# Patient Record
Sex: Male | Born: 1990 | Race: White | Hispanic: No | Marital: Single | Smoking: Current some day smoker
Health system: Southern US, Community
[De-identification: ages and names within clinical notes are randomized; demographics above are authoritative.]

---
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
## 2011-06-25 LAB — URINE MICROSCOPIC-ADD ON

## 2011-06-25 LAB — URINALYSIS, ROUTINE W REFLEX MICROSCOPIC
Glucose, UA: NEGATIVE
Ketones, ur: NEGATIVE
Leukocytes, UA: NEGATIVE
Nitrite: NEGATIVE
Specific Gravity, Urine: 1.025
pH: 6

## 2011-06-25 LAB — RPR: RPR Ser Ql: NONREACTIVE

## 2011-06-25 LAB — DIFFERENTIAL
Basophils Relative: 1
Lymphocytes Relative: 30
Monocytes Absolute: 0.8
Monocytes Relative: 11
Neutro Abs: 3.2

## 2011-06-25 LAB — CBC
HCT: 43.3
Hemoglobin: 15.2
MCHC: 35
RBC: 4.99

## 2011-06-25 LAB — TSH: TSH: 2.042

## 2011-06-25 LAB — HEPATIC FUNCTION PANEL
AST: 22
Albumin: 4.5

## 2011-06-25 LAB — RAPID URINE DRUG SCREEN, HOSP PERFORMED
Amphetamines: NOT DETECTED
Barbiturates: NOT DETECTED
Benzodiazepines: NOT DETECTED
Opiates: NOT DETECTED
Tetrahydrocannabinol: NOT DETECTED

## 2011-06-25 LAB — BASIC METABOLIC PANEL
CO2: 28
Calcium: 10.2
Potassium: 4.2
Sodium: 141

## 2011-06-25 LAB — I-STAT 8, (EC8 V) (CONVERTED LAB)
BUN: 12
Chloride: 106
Glucose, Bld: 96
Potassium: 3.8
pCO2, Ven: 41.6 — ABNORMAL LOW
pH, Ven: 7.39 — ABNORMAL HIGH

## 2011-06-25 LAB — VALPROIC ACID LEVEL: Valproic Acid Lvl: 54.6

## 2011-06-25 LAB — LITHIUM LEVEL: Lithium Lvl: <0.25 — ABNORMAL LOW

## 2013-05-17 ENCOUNTER — Encounter (HOSPITAL_COMMUNITY): Payer: Self-pay | Admitting: Emergency Medicine

## 2013-05-17 ENCOUNTER — Emergency Department (HOSPITAL_COMMUNITY): Payer: Managed Care, Other (non HMO)

## 2013-05-17 ENCOUNTER — Emergency Department (HOSPITAL_COMMUNITY)
Admission: EM | Admit: 2013-05-17 | Discharge: 2013-05-18 | Disposition: A | Discharge status: Home / Self Care | Payer: Managed Care, Other (non HMO) | Attending: Emergency Medicine | Admitting: Emergency Medicine

## 2013-05-17 DIAGNOSIS — Z880 Allergy status to penicillin: Secondary | ICD-10-CM | POA: Insufficient documentation

## 2013-05-17 DIAGNOSIS — Z79899 Other long term (current) drug therapy: Secondary | ICD-10-CM | POA: Insufficient documentation

## 2013-05-17 DIAGNOSIS — F17200 Nicotine dependence, unspecified, uncomplicated: Secondary | ICD-10-CM | POA: Insufficient documentation

## 2013-05-17 DIAGNOSIS — N200 Calculus of kidney: Secondary | ICD-10-CM

## 2013-05-17 LAB — CBC WITH DIFFERENTIAL/PLATELET
Basophils Absolute: 0 10*3/uL (ref 0.0–0.1)
HCT: 42.9 % (ref 39.0–52.0)
Hemoglobin: 15 g/dL (ref 13.0–17.0)
Lymphocytes Relative: 8 % — ABNORMAL LOW (ref 12–46)
Monocytes Absolute: 1.2 10*3/uL — ABNORMAL HIGH (ref 0.1–1.0)
Monocytes Relative: 11 % (ref 3–12)
Neutro Abs: 8.9 10*3/uL — ABNORMAL HIGH (ref 1.7–7.7)
RBC: 4.92 MIL/uL (ref 4.22–5.81)
WBC: 11 10*3/uL — ABNORMAL HIGH (ref 4.0–10.5)

## 2013-05-17 LAB — URINALYSIS, ROUTINE W REFLEX MICROSCOPIC
Ketones, ur: NEGATIVE mg/dL
Protein, ur: 30 mg/dL — AB
Urobilinogen, UA: 0.2 mg/dL (ref 0.0–1.0)

## 2013-05-17 LAB — URINE MICROSCOPIC-ADD ON

## 2013-05-17 LAB — COMPREHENSIVE METABOLIC PANEL
AST: 29 U/L (ref 0–37)
BUN: 14 mg/dL (ref 6–23)
CO2: 24 mEq/L (ref 19–32)
Chloride: 104 mEq/L (ref 96–112)
Creatinine, Ser: 1.05 mg/dL (ref 0.50–1.35)
GFR calc non Af Amer: >90 mL/min (ref >90)
Total Bilirubin: 0.9 mg/dL (ref 0.3–1.2)

## 2013-05-17 MED ORDER — OXYCODONE-ACETAMINOPHEN 5-325 MG PO TABS: 1.0000 | ORAL_TABLET | Freq: Four times a day (QID) | ORAL | Status: DC | PRN

## 2013-05-17 MED ORDER — TAMSULOSIN HCL 0.4 MG PO CAPS
0.4000 mg | ORAL_CAPSULE | Freq: Once | ORAL | Status: AC
  Administered 2013-05-17: 0.4 mg via ORAL
  Filled 2013-05-17: qty 1

## 2013-05-17 MED ORDER — SODIUM CHLORIDE 0.9 % IV BOLUS (SEPSIS)
1000.0000 mL | Freq: Once | INTRAVENOUS | Status: AC
  Administered 2013-05-17: 1000 mL via INTRAVENOUS

## 2013-05-17 MED ORDER — ONDANSETRON HCL 4 MG/2ML IJ SOLN
4.0000 mg | Freq: Once | INTRAMUSCULAR | Status: AC
  Administered 2013-05-17: 4 mg via INTRAVENOUS
  Filled 2013-05-17: qty 2

## 2013-05-17 MED ORDER — CIPROFLOXACIN HCL 500 MG PO TABS
500.0000 mg | ORAL_TABLET | Freq: Once | ORAL | Status: AC
  Administered 2013-05-17: 500 mg via ORAL
  Filled 2013-05-17: qty 1

## 2013-05-17 MED ORDER — HYDROMORPHONE HCL PF 1 MG/ML IJ SOLN
1.0000 mg | Freq: Once | INTRAMUSCULAR | Status: AC
  Administered 2013-05-17: 1 mg via INTRAVENOUS
  Filled 2013-05-17: qty 1

## 2013-05-17 MED ORDER — CIPROFLOXACIN HCL 500 MG PO TABS: 500.0000 mg | ORAL_TABLET | Freq: Two times a day (BID) | ORAL | Status: DC

## 2013-05-17 MED ORDER — TAMSULOSIN HCL 0.4 MG PO CAPS: 0.4000 mg | ORAL_CAPSULE | Freq: Every day | ORAL | Status: DC

## 2013-05-17 MED ORDER — KETOROLAC TROMETHAMINE 30 MG/ML IJ SOLN
30.0000 mg | Freq: Once | INTRAMUSCULAR | Status: AC
  Administered 2013-05-17: 30 mg via INTRAVENOUS
  Filled 2013-05-17: qty 1

## 2013-05-17 MED ORDER — LORAZEPAM 2 MG/ML IJ SOLN
0.5000 mg | Freq: Once | INTRAMUSCULAR | Status: AC
  Administered 2013-05-17: 0.5 mg via INTRAVENOUS
  Filled 2013-05-17: qty 1

## 2013-05-17 MED ORDER — PROMETHAZINE HCL 25 MG PO TABS: 25.0000 mg | ORAL_TABLET | Freq: Four times a day (QID) | ORAL | Status: DC | PRN

## 2013-05-17 NOTE — ED Provider Notes (Signed)
CSN: [id number]     Arrival date & time 05/17/13  1819 History   First MD Initiated Contact with Patient 05/17/13 1844     Chief Complaint  Patient presents with  . Testicle Pain   (Consider location/radiation/quality/duration/timing/severity/associated sxs/prior Treatment) Patient is a 22 y.o. male presenting with testicular pain. The history is provided by the patient (pt complains of scrotal pain.).  Testicle Pain This is a new problem. The current episode started less than 1 hour ago. The problem occurs constantly. The problem has not changed since onset.Pertinent negatives include no chest pain, no abdominal pain and no headaches. Nothing aggravates the symptoms. Nothing relieves the symptoms.    History reviewed. No pertinent past medical history. History reviewed. No pertinent past surgical history. History reviewed. No pertinent family history. History  Substance Use Topics  . Smoking status: Current Some Day Smoker  . Smokeless tobacco: Not on file  . Alcohol Use: Yes    Review of Systems  Constitutional: Negative for appetite change and fatigue.  HENT: Negative for congestion, sinus pressure and ear discharge.   Eyes: Negative for discharge.  Respiratory: Negative for cough.   Cardiovascular: Negative for chest pain.  Gastrointestinal: Negative for abdominal pain and diarrhea.  Genitourinary: Positive for testicular pain. Negative for frequency and hematuria.  Musculoskeletal: Negative for back pain.  Skin: Negative for rash.  Neurological: Negative for seizures and headaches.  Psychiatric/Behavioral: Negative for hallucinations.    Allergies  Penicillins  Home Medications   Current Outpatient Rx  Name  Route  Sig  Dispense  Refill  . oxyCODONE-acetaminophen (PERCOCET/ROXICET) 5-325 MG per tablet   Oral   Take 1 tablet by mouth every 6 (six) hours as needed for pain.   20 tablet   0   . promethazine (PHENERGAN) 25 MG tablet   Oral   Take 1 tablet (25 mg  total) by mouth every 6 (six) hours as needed for nausea.   15 tablet   0   . tamsulosin (FLOMAX) 0.4 MG CAPS capsule   Oral   Take 1 capsule (0.4 mg total) by mouth daily.   6 capsule   0    BP 123/101  Pulse 89  Temp(Src) 97.5 F (36.4 C) (Oral)  Resp 24  SpO2 98% Physical Exam  Constitutional: He is oriented to person, place, and time. He appears well-developed.  HENT:  Head: Normocephalic.  Eyes: Conjunctivae and EOM are normal. No scleral icterus.  Neck: Neck supple. No thyromegaly present.  Cardiovascular: Normal rate and regular rhythm.  Exam reveals no gallop and no friction rub.   No murmur heard. Pulmonary/Chest: No stridor. He has no wheezes. He has no rales. He exhibits no tenderness.  Abdominal: He exhibits no distension. There is no tenderness. There is no rebound.  Genitourinary:  Tender scrotum.  Not swollen  Musculoskeletal: Normal range of motion. He exhibits no edema.  Lymphadenopathy:    He has no cervical adenopathy.  Neurological: He is oriented to person, place, and time. Coordination normal.  Skin: No rash noted. No erythema.  Psychiatric: He has a normal mood and affect. His behavior is normal.    ED Course  Procedures (including critical care time) Labs Review Labs Reviewed  CBC WITH DIFFERENTIAL - Abnormal; Notable for the following:    WBC 11.0 (*)    Neutrophils Relative % 81 (*)    Neutro Abs 8.9 (*)    Lymphocytes Relative 8 (*)    Monocytes Absolute 1.2 (*)  All other components within normal limits  COMPREHENSIVE METABOLIC PANEL - Abnormal; Notable for the following:    Glucose, Bld 106 (*)    All other components within normal limits  URINALYSIS, ROUTINE W REFLEX MICROSCOPIC - Abnormal; Notable for the following:    Color, Urine AMBER (*)    APPearance CLOUDY (*)    Specific Gravity, Urine 1.031 (*)    Hgb urine dipstick LARGE (*)    Bilirubin Urine SMALL (*)    Protein, ur 30 (*)    All other components within normal  limits  URINE MICROSCOPIC-ADD ON - Abnormal; Notable for the following:    Bacteria, UA MANY (*)    Crystals CA OXALATE CRYSTALS (*)    All other components within normal limits  URINE CULTURE   Imaging Review Ct Abdomen Pelvis Wo Contrast  05/17/2013   *RADIOLOGY REPORT*  Clinical Data: Kidney stone.  CT ABDOMEN AND PELVIS WITHOUT CONTRAST  Technique:  Multidetector CT imaging of the abdomen and pelvis was performed following the standard protocol without intravenous contrast.  Comparison: None.  Findings:  BODY WALL: Unremarkable.  LOWER CHEST:  Mediastinum: Unremarkable.  Lungs/pleura: No consolidation.  ABDOMEN/PELVIS:  Liver: No focal abnormality.  Biliary: No evidence of biliary obstruction or stone.  Pancreas: Unremarkable.  Spleen: Unremarkable.  Adrenals: Unremarkable.  Kidneys and ureters: Moderate left hydroureteronephrosis secondary to a 4 mm stone at the left ureterovesicular junction.  Additional punctate stone in the lower pole left kidney.  The left kidney is enlarged and relatively edematous compared to the right.  Bladder: Unremarkable.  Bowel: No obstruction. Normal appendix.  Retroperitoneum: No mass or adenopathy.  Peritoneum: No free fluid or gas.  Reproductive: Unremarkable.  Vascular: No acute abnormality.  OSSEOUS: No acute abnormalities. No suspicious lytic or blastic lesions.  IMPRESSION:  1. Moderate left hydroureteronephrosis secondary to a 4 mm ureterovesicular junction calculus. 2.  Punctate left nephrolithiasis.   Original Report Authenticated By: Tiburcio Pea   US Scrotum  05/17/2013   CLINICAL DATA:  22 year old male with bilateral testicle pain and dysuria. No known injury.  EXAM: SCROTAL ULTRASOUND  DOPPLER ULTRASOUND OF THE TESTICLES  TECHNIQUE: Complete ultrasound examination of the testicles, epididymis, and other scrotal structures was performed. Color and spectral Doppler ultrasound were also utilized to evaluate blood flow to the testicles.  COMPARISON:  None.   FINDINGS: Right testis:  4.3 x 2.5 x 3.6 cm. Echotexture within normal limits.  Left testis:  5.2 x 2.2 x 3.4 cm. Echotexture within normal limits.  Right epididymis: Normal in size and appearance. Incidental 3-4 mm cyst.  Left epididymis:  Normal in size and appearance.  Hydrocele:  Trace bilateral.  Varicocele:  None.  Pulsed Doppler interrogation of both testes demonstrates low resistance arterial and venous waveforms bilaterally.  IMPRESSION: No evidence of testicular torsion or acute scrotal inflammation. Trace bilateral hydroceles.   Electronically Signed   By: Augusto Gamble   On: 05/17/2013 20:45   [place] Art/ven Flow Abd Pelv Doppler  05/17/2013   CLINICAL DATA:  22 year old male with bilateral testicle pain and dysuria. No known injury.  EXAM: SCROTAL ULTRASOUND  DOPPLER ULTRASOUND OF THE TESTICLES  TECHNIQUE: Complete ultrasound examination of the testicles, epididymis, and other scrotal structures was performed. Color and spectral Doppler ultrasound were also utilized to evaluate blood flow to the testicles.  COMPARISON:  None.  FINDINGS: Right testis:  4.3 x 2.5 x 3.6 cm. Echotexture within normal limits.  Left testis:  5.2 x 2.2 x 3.4 cm.  Echotexture within normal limits.  Right epididymis: Normal in size and appearance. Incidental 3-4 mm cyst.  Left epididymis:  Normal in size and appearance.  Hydrocele:  Trace bilateral.  Varicocele:  None.  Pulsed Doppler interrogation of both testes demonstrates low resistance arterial and venous waveforms bilaterally.  IMPRESSION: No evidence of testicular torsion or acute scrotal inflammation. Trace bilateral hydroceles.   Electronically Signed   By: Augusto Gamble   On: 05/17/2013 20:45    MDM   1. Kidney stone       Benny Lennert, MD 05/17/13 [phone]
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
## 2013-05-19 LAB — URINE CULTURE: Culture: NO GROWTH

## 2014-10-04 IMAGING — US US SCROTUM
1 series · 14 of 25 positions shown · non-contrast
Comparison: None.

CLINICAL DATA: 22-year-old male with bilateral testicle pain and
dysuria. No known injury.

EXAM:
SCROTAL ULTRASOUND
DOPPLER ULTRASOUND OF THE TESTICLES
TECHNIQUE: Complete ultrasound examination of the testicles, epididymis, and
other scrotal structures was performed. Color and spectral Doppler
ultrasound were also utilized to evaluate blood flow to the
testicles.

[Series 1: us scrotum · 0.08mm/px · 14 of 61 slices shown]
[im 1/61]
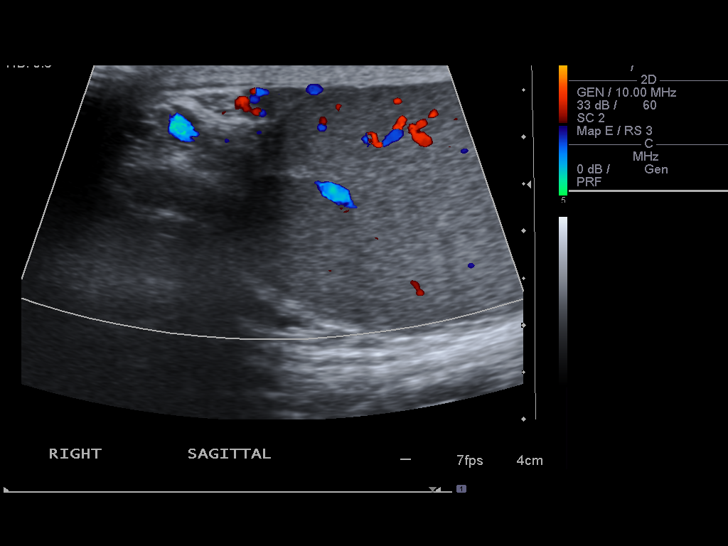
[im 6/61]
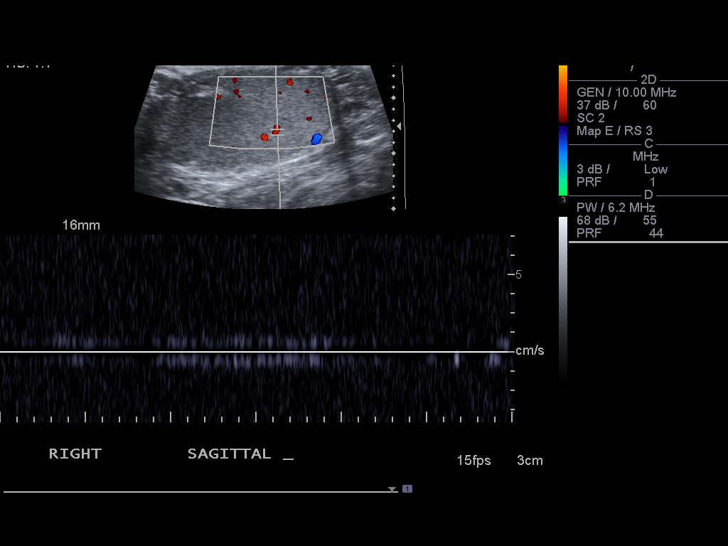
[im 11/61]
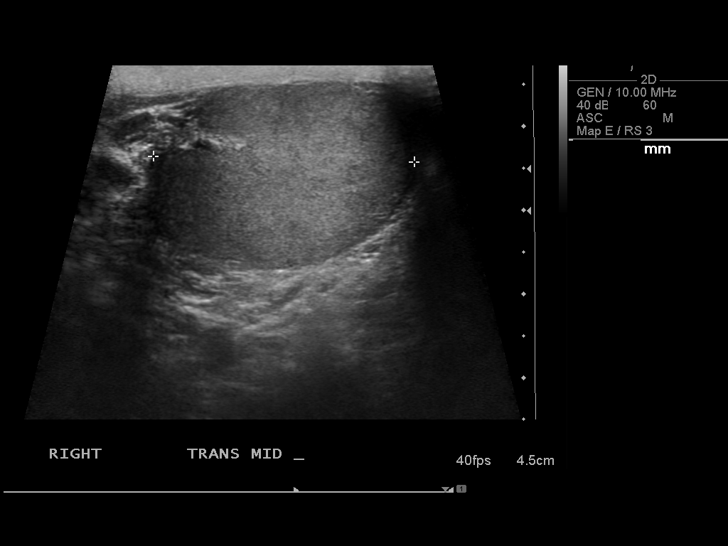
[im 16/61]
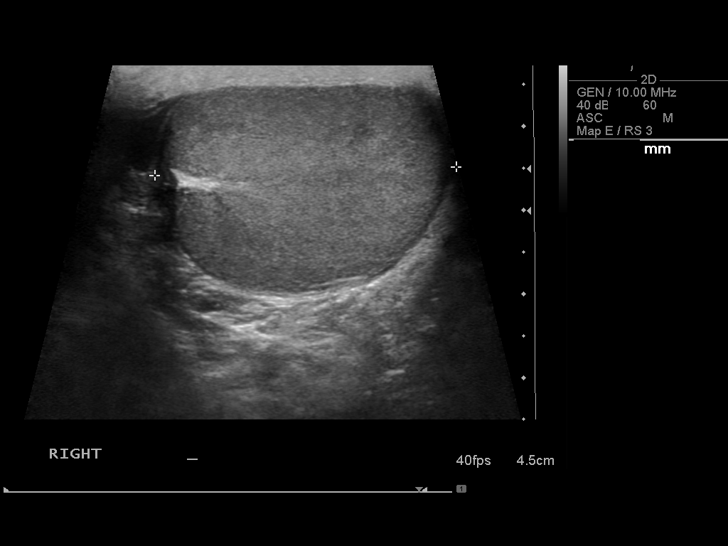
[im 21/61]
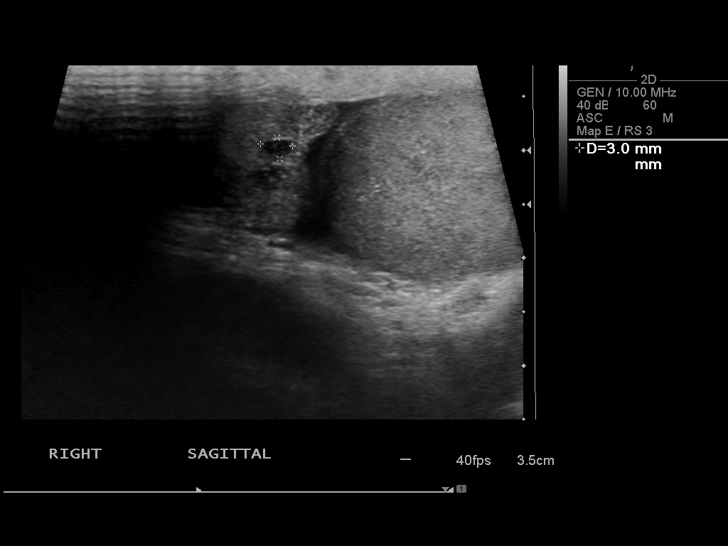
[im 23/61]
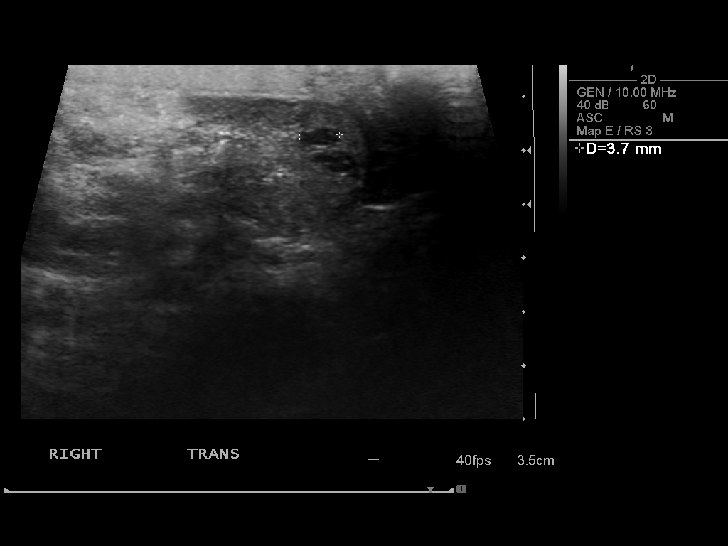
[im 28/61]
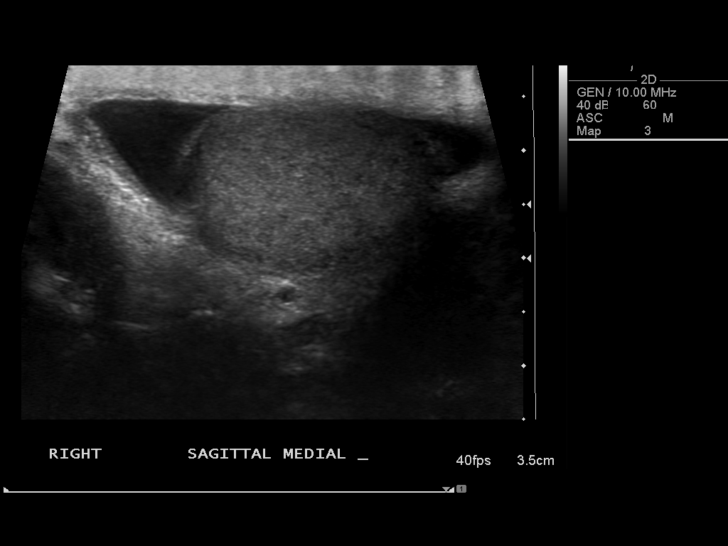
[im 33/61]
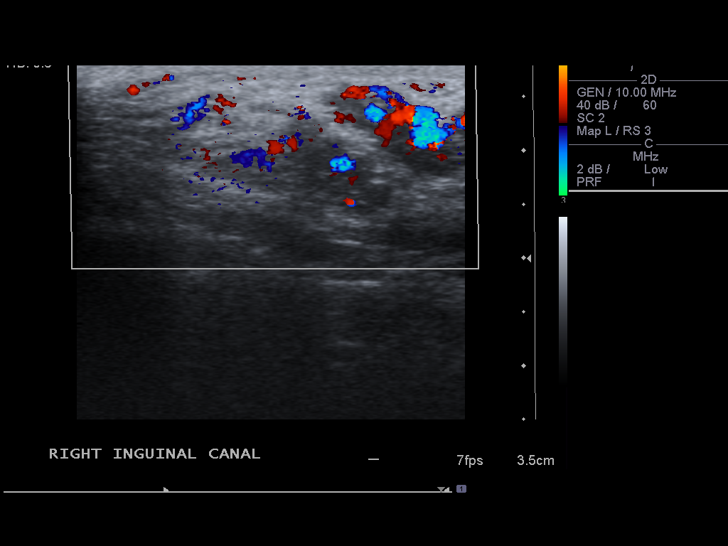
[im 38/61]
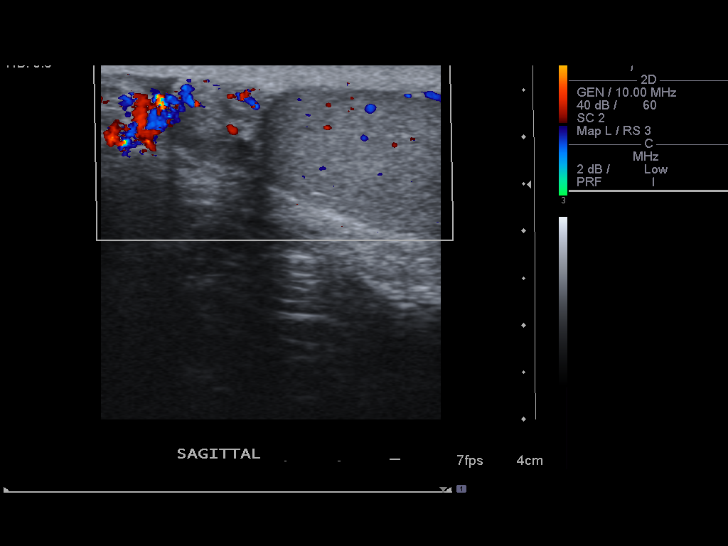
[im 41/61]
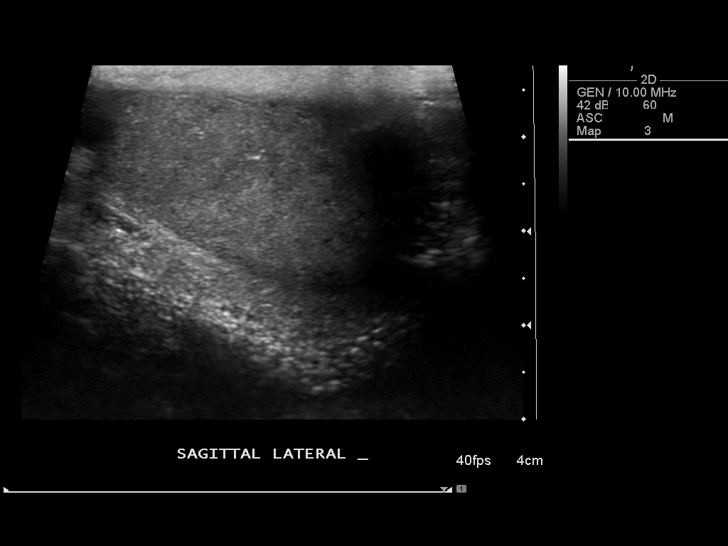
[im 46/61]
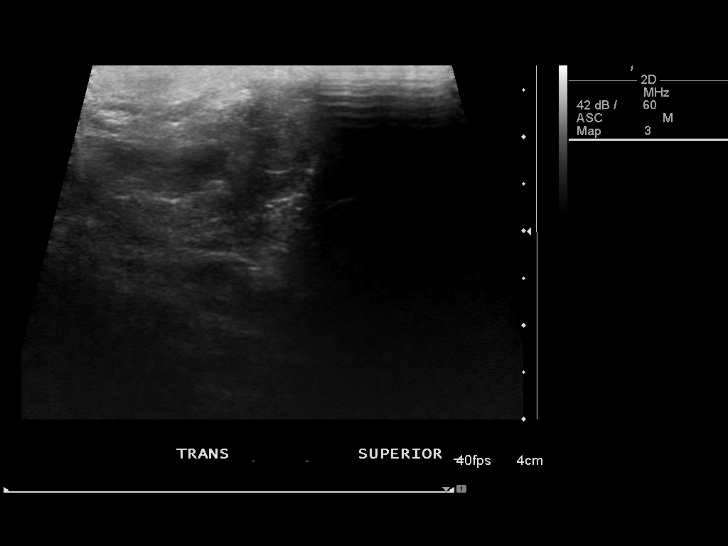
[im 51/61]
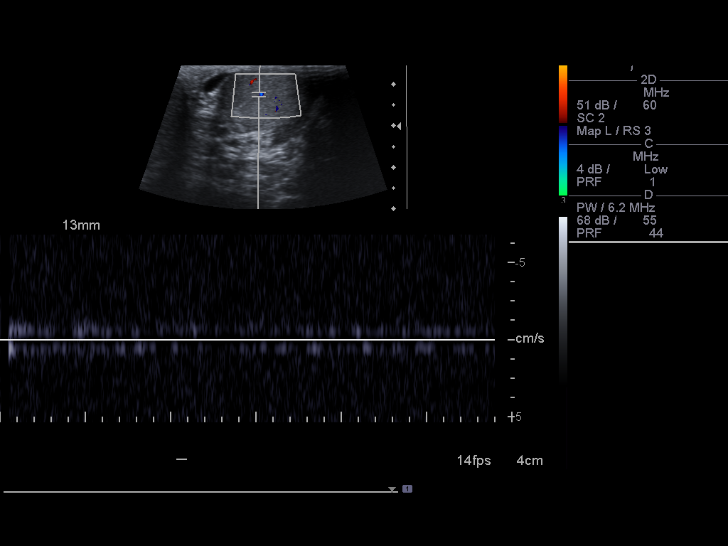
[im 56/61]
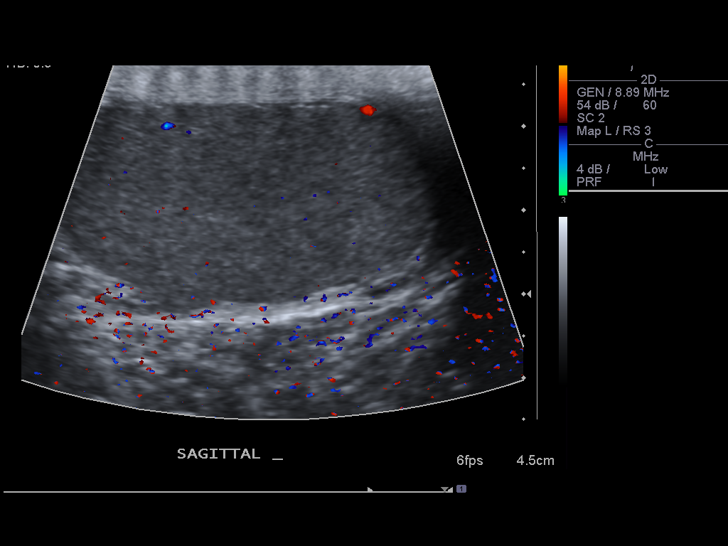
[im 61/61]
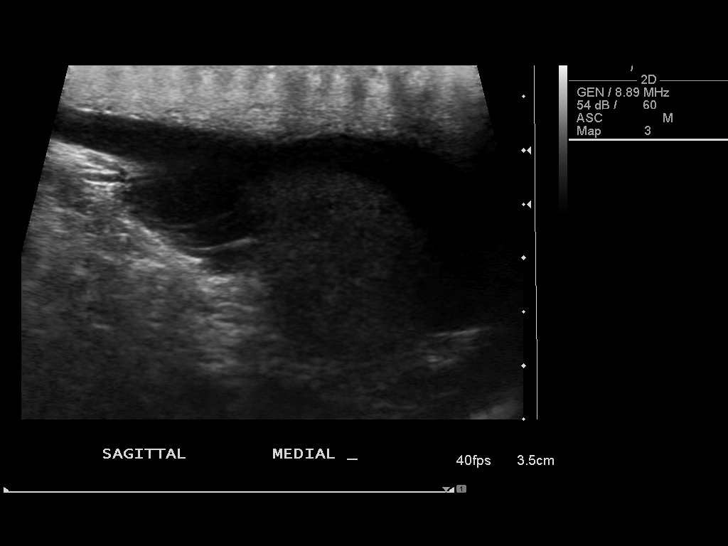

[14 of 25 positions shown; findings below may reference images not displayed]

FINDINGS: Right testis:  4.3 x 2.5 x 3.6 cm. Echotexture within normal limits.

Left testis:  5.2 x 2.2 x 3.4 cm. Echotexture within normal limits.

Right epididymis: Normal in size and appearance. Incidental 3-4 mm
cyst.

Left epididymis:  Normal in size and appearance.

Hydrocele:  Trace bilateral.

Varicocele:  None.

Pulsed Doppler interrogation of both testes demonstrates low
resistance arterial and venous waveforms bilaterally.
IMPRESSION: No evidence of testicular torsion or acute scrotal inflammation.
Trace bilateral hydroceles.

## 2014-10-10 ENCOUNTER — Emergency Department (HOSPITAL_COMMUNITY): Payer: Managed Care, Other (non HMO)

## 2014-10-10 ENCOUNTER — Emergency Department (HOSPITAL_COMMUNITY)
Admission: EM | Admit: 2014-10-10 | Discharge: 2014-10-10 | Disposition: A | Discharge status: Home / Self Care | Payer: Managed Care, Other (non HMO) | Attending: Emergency Medicine | Admitting: Emergency Medicine

## 2014-10-10 ENCOUNTER — Encounter (HOSPITAL_COMMUNITY): Payer: Self-pay | Admitting: Emergency Medicine

## 2014-10-10 DIAGNOSIS — Z792 Long term (current) use of antibiotics: Secondary | ICD-10-CM | POA: Insufficient documentation

## 2014-10-10 DIAGNOSIS — Z720 Tobacco use: Secondary | ICD-10-CM | POA: Insufficient documentation

## 2014-10-10 DIAGNOSIS — Y998 Other external cause status: Secondary | ICD-10-CM | POA: Insufficient documentation

## 2014-10-10 DIAGNOSIS — Y92410 Unspecified street and highway as the place of occurrence of the external cause: Secondary | ICD-10-CM | POA: Insufficient documentation

## 2014-10-10 DIAGNOSIS — Z79899 Other long term (current) drug therapy: Secondary | ICD-10-CM | POA: Insufficient documentation

## 2014-10-10 DIAGNOSIS — Y9389 Activity, other specified: Secondary | ICD-10-CM | POA: Insufficient documentation

## 2014-10-10 DIAGNOSIS — Z880 Allergy status to penicillin: Secondary | ICD-10-CM | POA: Insufficient documentation

## 2014-10-10 DIAGNOSIS — S80211A Abrasion, right knee, initial encounter: Secondary | ICD-10-CM

## 2014-10-10 MED ORDER — IBUPROFEN 800 MG PO TABS: 800.0000 mg | ORAL_TABLET | Freq: Three times a day (TID) | ORAL | Status: DC

## 2014-10-10 MED ORDER — METHOCARBAMOL 500 MG PO TABS: 500.0000 mg | ORAL_TABLET | Freq: Two times a day (BID) | ORAL | Status: DC
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
## 2014-10-10 NOTE — Discharge Instructions (Signed)
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
## 2018-03-17 ENCOUNTER — Emergency Department (HOSPITAL_COMMUNITY): Payer: Self-pay

## 2018-03-17 ENCOUNTER — Encounter (HOSPITAL_COMMUNITY): Payer: Self-pay | Admitting: Emergency Medicine

## 2018-03-17 ENCOUNTER — Inpatient Hospital Stay (HOSPITAL_COMMUNITY)
Admission: EM | Admit: 2018-03-17 | Discharge: 2018-03-20 | DRG: 917 | Disposition: A | Discharge status: Home / Self Care | Payer: Self-pay | Attending: Internal Medicine | Admitting: Internal Medicine

## 2018-03-17 DIAGNOSIS — Z79899 Other long term (current) drug therapy: Secondary | ICD-10-CM

## 2018-03-17 DIAGNOSIS — Y907 Blood alcohol level of 200-239 mg/100 ml: Secondary | ICD-10-CM | POA: Diagnosis present

## 2018-03-17 DIAGNOSIS — F10929 Alcohol use, unspecified with intoxication, unspecified: Secondary | ICD-10-CM

## 2018-03-17 DIAGNOSIS — J9602 Acute respiratory failure with hypercapnia: Secondary | ICD-10-CM | POA: Diagnosis present

## 2018-03-17 DIAGNOSIS — J690 Pneumonitis due to inhalation of food and vomit: Secondary | ICD-10-CM | POA: Diagnosis present

## 2018-03-17 DIAGNOSIS — N179 Acute kidney failure, unspecified: Secondary | ICD-10-CM | POA: Diagnosis present

## 2018-03-17 DIAGNOSIS — F17200 Nicotine dependence, unspecified, uncomplicated: Secondary | ICD-10-CM | POA: Diagnosis present

## 2018-03-17 DIAGNOSIS — G92 Toxic encephalopathy: Secondary | ICD-10-CM | POA: Diagnosis present

## 2018-03-17 DIAGNOSIS — T510X1A Toxic effect of ethanol, accidental (unintentional), initial encounter: Principal | ICD-10-CM | POA: Diagnosis present

## 2018-03-17 DIAGNOSIS — J9601 Acute respiratory failure with hypoxia: Secondary | ICD-10-CM | POA: Diagnosis present

## 2018-03-17 DIAGNOSIS — Z880 Allergy status to penicillin: Secondary | ICD-10-CM

## 2018-03-17 DIAGNOSIS — E876 Hypokalemia: Secondary | ICD-10-CM | POA: Diagnosis present

## 2018-03-17 DIAGNOSIS — E872 Acidosis: Secondary | ICD-10-CM | POA: Diagnosis present

## 2018-03-17 DIAGNOSIS — F10129 Alcohol abuse with intoxication, unspecified: Secondary | ICD-10-CM | POA: Diagnosis present

## 2018-03-17 DIAGNOSIS — G9340 Encephalopathy, unspecified: Secondary | ICD-10-CM

## 2018-03-17 LAB — I-STAT ARTERIAL BLOOD GAS, ED
ACID-BASE DEFICIT: 7 mmol/L — AB (ref 0.0–2.0)
Acid-base deficit: 7 mmol/L — ABNORMAL HIGH (ref 0.0–2.0)
BICARBONATE: 21.9 mmol/L (ref 20.0–28.0)
Bicarbonate: 22.6 mmol/L (ref 20.0–28.0)
O2 SAT: 93 %
O2 Saturation: 94 %
PCO2 ART: 53 mmHg — AB (ref 32.0–48.0)
PH ART: 7.175 — AB (ref 7.350–7.450)
PO2 ART: 81 mmHg — AB (ref 83.0–108.0)
Patient temperature: 95.6
TCO2: 24 mmol/L (ref 22–32)
TCO2: 24 mmol/L (ref 22–32)
pCO2 arterial: 60.9 mmHg — ABNORMAL HIGH (ref 32.0–48.0)
pH, Arterial: 7.215 — ABNORMAL LOW (ref 7.350–7.450)
pO2, Arterial: 85 mmHg (ref 83.0–108.0)

## 2018-03-17 LAB — CBC WITH DIFFERENTIAL/PLATELET
ABS IMMATURE GRANULOCYTES: 0.1 10*3/uL (ref 0.0–0.1)
BASOS ABS: 0.1 10*3/uL (ref 0.0–0.1)
Basophils Relative: 1 %
EOS PCT: 3 %
Eosinophils Absolute: 0.3 10*3/uL (ref 0.0–0.7)
HCT: 49.4 % (ref 39.0–52.0)
HEMOGLOBIN: 16 g/dL (ref 13.0–17.0)
Immature Granulocytes: 1 %
LYMPHS PCT: 40 %
Lymphs Abs: 3.4 10*3/uL (ref 0.7–4.0)
MCH: 29.6 pg (ref 26.0–34.0)
MCHC: 32.4 g/dL (ref 30.0–36.0)
MCV: 91.5 fL (ref 78.0–100.0)
MONOS PCT: 13 %
Monocytes Absolute: 1.1 10*3/uL — ABNORMAL HIGH (ref 0.1–1.0)
Neutro Abs: 3.7 10*3/uL (ref 1.7–7.7)
Neutrophils Relative %: 42 %
Platelets: 275 10*3/uL (ref 150–400)
RBC: 5.4 MIL/uL (ref 4.22–5.81)
RDW: 12.7 % (ref 11.5–15.5)
WBC: 8.6 10*3/uL (ref 4.0–10.5)

## 2018-03-17 LAB — LACTIC ACID, PLASMA
LACTIC ACID, VENOUS: 2 mmol/L — AB (ref 0.5–1.9)
LACTIC ACID, VENOUS: 2 mmol/L — AB (ref 0.5–1.9)
LACTIC ACID, VENOUS: 1.7 mmol/L (ref 0.5–1.9)

## 2018-03-17 LAB — I-STAT VENOUS BLOOD GAS, ED
Acid-base deficit: 8 mmol/L — ABNORMAL HIGH (ref 0.0–2.0)
Acid-base deficit: 9 mmol/L — ABNORMAL HIGH (ref 0.0–2.0)
Bicarbonate: 20.9 mmol/L (ref 20.0–28.0)
Bicarbonate: 21.5 mmol/L (ref 20.0–28.0)
O2 Saturation: 75 %
O2 Saturation: 76 %
PCO2 VEN: 59.2 mmHg (ref 44.0–60.0)
PCO2 VEN: 60.8 mmHg — AB (ref 44.0–60.0)
PH VEN: 7.156 — AB (ref 7.250–7.430)
PH VEN: 7.157 — AB (ref 7.250–7.430)
Patient temperature: 37
TCO2: 23 mmol/L (ref 22–32)
TCO2: 23 mmol/L (ref 22–32)
pO2, Ven: 52 mmHg — ABNORMAL HIGH (ref 32.0–45.0)
pO2, Ven: 52 mmHg — ABNORMAL HIGH (ref 32.0–45.0)

## 2018-03-17 LAB — BLOOD GAS, ARTERIAL
Acid-base deficit: 0.6 mmol/L (ref 0.0–2.0)
Bicarbonate: 23.6 mmol/L (ref 20.0–28.0)
Drawn by: 44166
O2 CONTENT: 1 L/min
O2 Saturation: 96.7 %
PH ART: 7.401 (ref 7.350–7.450)
PO2 ART: 83.9 mmHg (ref 83.0–108.0)
Patient temperature: 98.9
pCO2 arterial: 38.9 mmHg (ref 32.0–48.0)

## 2018-03-17 LAB — ETHANOL: Alcohol, Ethyl (B): 224 mg/dL — ABNORMAL HIGH (ref <10)

## 2018-03-17 LAB — COMPREHENSIVE METABOLIC PANEL
ALK PHOS: 106 U/L (ref 38–126)
ALT: 48 U/L — AB (ref 0–44)
AST: 47 U/L — ABNORMAL HIGH (ref 15–41)
Albumin: 4 g/dL (ref 3.5–5.0)
Anion gap: 13 (ref 5–15)
BUN: 8 mg/dL (ref 6–20)
CALCIUM: 8.7 mg/dL — AB (ref 8.9–10.3)
CO2: 23 mmol/L (ref 22–32)
CREATININE: 1.3 mg/dL — AB (ref 0.61–1.24)
Chloride: 101 mmol/L (ref 98–111)
GFR calc non Af Amer: >60 mL/min (ref >60)
Glucose, Bld: 160 mg/dL — ABNORMAL HIGH (ref 70–99)
Potassium: 3.2 mmol/L — ABNORMAL LOW (ref 3.5–5.1)
SODIUM: 137 mmol/L (ref 135–145)
Total Bilirubin: 0.6 mg/dL (ref 0.3–1.2)
Total Protein: 7.4 g/dL (ref 6.5–8.1)

## 2018-03-17 LAB — GLUCOSE, CAPILLARY: Glucose-Capillary: 90 mg/dL (ref 70–99)

## 2018-03-17 LAB — MRSA PCR SCREENING: MRSA by PCR: NEGATIVE

## 2018-03-17 LAB — RAPID URINE DRUG SCREEN, HOSP PERFORMED
Amphetamines: NOT DETECTED
Benzodiazepines: NOT DETECTED
Cocaine: POSITIVE — AB
OPIATES: POSITIVE — AB
TETRAHYDROCANNABINOL: NOT DETECTED

## 2018-03-17 LAB — PROCALCITONIN: Procalcitonin: 0.1 ng/mL

## 2018-03-17 LAB — I-STAT CG4 LACTIC ACID, ED: LACTIC ACID, VENOUS: 2.34 mmol/L — AB (ref 0.5–1.9)

## 2018-03-17 MED ORDER — ALBUTEROL SULFATE (2.5 MG/3ML) 0.083% IN NEBU
2.5000 mg | INHALATION_SOLUTION | Freq: Four times a day (QID) | RESPIRATORY_TRACT | Status: DC
  Administered 2018-03-17: 2.5 mg via RESPIRATORY_TRACT
  Filled 2018-03-17: qty 3

## 2018-03-17 MED ORDER — ORAL CARE MOUTH RINSE
15.0000 mL | Freq: Two times a day (BID) | OROMUCOSAL | Status: DC
Start: 2018-03-17 — End: 2018-03-20
  Administered 2018-03-17 – 2018-03-19 (×5): 15 mL via OROMUCOSAL

## 2018-03-17 MED ORDER — SODIUM CHLORIDE 0.9 % IV SOLN
2.0000 g | INTRAVENOUS | Status: DC
  Administered 2018-03-18 – 2018-03-20 (×3): 2 g via INTRAVENOUS
  Filled 2018-03-17 (×3): qty 20

## 2018-03-17 MED ORDER — SODIUM CHLORIDE 0.9 % IV BOLUS
1000.0000 mL | Freq: Once | INTRAVENOUS | Status: AC
  Administered 2018-03-17: 1000 mL via INTRAVENOUS

## 2018-03-17 MED ORDER — ONDANSETRON HCL 4 MG/2ML IJ SOLN
4.0000 mg | Freq: Once | INTRAMUSCULAR | Status: AC
  Administered 2018-03-17: 4 mg via INTRAVENOUS

## 2018-03-17 MED ORDER — SODIUM CHLORIDE 0.9 % IV SOLN
INTRAVENOUS | Status: DC
  Administered 2018-03-17 – 2018-03-19 (×2): via INTRAVENOUS

## 2018-03-17 MED ORDER — METRONIDAZOLE IN NACL 5-0.79 MG/ML-% IV SOLN
500.0000 mg | Freq: Three times a day (TID) | INTRAVENOUS | Status: DC
  Administered 2018-03-17 – 2018-03-20 (×9): 500 mg via INTRAVENOUS
  Filled 2018-03-17 (×9): qty 100

## 2018-03-17 MED ORDER — ALBUTEROL SULFATE (2.5 MG/3ML) 0.083% IN NEBU: 2.5000 mg | INHALATION_SOLUTION | Freq: Four times a day (QID) | RESPIRATORY_TRACT | Status: DC | PRN

## 2018-03-17 MED ORDER — SODIUM CHLORIDE 0.9 % IV SOLN
2.0000 g | Freq: Once | INTRAVENOUS | Status: AC
  Administered 2018-03-17: 2 g via INTRAVENOUS
  Filled 2018-03-17: qty 20

## 2018-03-17 MED ORDER — CLINDAMYCIN PHOSPHATE 600 MG/50ML IV SOLN
600.0000 mg | Freq: Once | INTRAVENOUS | Status: DC
Start: 2018-03-17 — End: 2018-03-17

## 2018-03-17 MED ORDER — SODIUM CHLORIDE 0.9 % IV SOLN
INTRAVENOUS | Status: DC
  Administered 2018-03-17 – 2018-03-19 (×5): via INTRAVENOUS

## 2018-03-17 MED ORDER — ALBUTEROL SULFATE (2.5 MG/3ML) 0.083% IN NEBU
2.5000 mg | INHALATION_SOLUTION | Freq: Three times a day (TID) | RESPIRATORY_TRACT | Status: DC
  Administered 2018-03-18: 2.5 mg via RESPIRATORY_TRACT
  Filled 2018-03-17: qty 3

## 2018-03-17 MED ORDER — THIAMINE HCL 100 MG/ML IJ SOLN
100.0000 mg | Freq: Every day | INTRAMUSCULAR | Status: DC
  Administered 2018-03-17 – 2018-03-18 (×2): 100 mg via INTRAVENOUS
  Filled 2018-03-17 (×2): qty 2

## 2018-03-17 MED ORDER — ENOXAPARIN SODIUM 40 MG/0.4ML ~~LOC~~ SOLN
40.0000 mg | SUBCUTANEOUS | Status: DC
  Administered 2018-03-17 – 2018-03-20 (×4): 40 mg via SUBCUTANEOUS
  Filled 2018-03-17 (×4): qty 0.4

## 2018-03-17 MED ORDER — SODIUM CHLORIDE 0.9 % IV SOLN
1.0000 mg | Freq: Once | INTRAVENOUS | Status: AC
  Administered 2018-03-17: 1 mg via INTRAVENOUS
  Filled 2018-03-17: qty 0.2

## 2018-03-17 MED ORDER — CHLORHEXIDINE GLUCONATE 0.12 % MT SOLN
15.0000 mL | Freq: Two times a day (BID) | OROMUCOSAL | Status: DC
  Administered 2018-03-17 – 2018-03-20 (×6): 15 mL via OROMUCOSAL
  Filled 2018-03-17 (×4): qty 15

## 2018-03-17 MED ORDER — SODIUM CHLORIDE 0.9 % IV SOLN
250.0000 mL | INTRAVENOUS | Status: DC | PRN
  Administered 2018-03-17: 250 mL via INTRAVENOUS

## 2018-03-17 MED ORDER — POTASSIUM CHLORIDE 10 MEQ/100ML IV SOLN
10.0000 meq | INTRAVENOUS | Status: AC
  Administered 2018-03-17 (×4): 10 meq via INTRAVENOUS
  Filled 2018-03-17 (×4): qty 100

## 2018-03-17 MED ORDER — FAMOTIDINE IN NACL 20-0.9 MG/50ML-% IV SOLN
20.0000 mg | Freq: Two times a day (BID) | INTRAVENOUS | Status: DC
  Administered 2018-03-17 – 2018-03-19 (×4): 20 mg via INTRAVENOUS
  Filled 2018-03-17 (×4): qty 50

## 2018-03-17 MED ORDER — ONDANSETRON HCL 4 MG/2ML IJ SOLN
4.0000 mg | Freq: Four times a day (QID) | INTRAMUSCULAR | Status: DC | PRN
  Administered 2018-03-17 (×2): 4 mg via INTRAVENOUS
  Filled 2018-03-17 (×2): qty 2

## 2018-03-17 MED ORDER — METRONIDAZOLE IN NACL 5-0.79 MG/ML-% IV SOLN
500.0000 mg | Freq: Once | INTRAVENOUS | Status: AC
  Administered 2018-03-17: 500 mg via INTRAVENOUS
  Filled 2018-03-17: qty 100
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
## 2018-03-17 NOTE — Plan of Care (Signed)
Patient discussed participating in AA meetings
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
## 2018-03-17 NOTE — Progress Notes (Signed)
ABG collected  

## 2018-03-17 NOTE — ED Provider Notes (Signed)
[facility] EMERGENCY DEPARTMENT Provider Note   CSN: [id number] Arrival date & time: 03/17/18  [id number]    History   Chief Complaint Chief Complaint  Patient presents with  . Drug Overdose    HPI Jason Sosa is a 27 y.o. male.  27 year old male with no significant past medical history presents to the emergency department via EMS after presumed overdose.  Patient was found unresponsive in the shower by police.  EMS administered 2mg  Narcan on arrival with little response.  He received an additional 2mg  Narcan shortly prior to arrival with gradual improvement to mentation.  Patient with oxygen saturations of 78% in the field.  This improved to 98% with bagging.  Patient slightly more responsive on arrival.  Reports drinking "enough" tonight, but cannot quantify a specific amount.  Denies use of any other illicit substances.  Per EMS, patient's roommate states that he "takes morphine sometimes".  Level 5 caveat secondary to acuity of condition.     History reviewed. No pertinent past medical history.  Patient Active Problem List   Diagnosis Date Noted  . Accidental ETOH poisoning (HCC) 03/17/2018    History reviewed. No pertinent surgical history.      Home Medications    Prior to Admission medications   Medication Sig Start Date End Date Taking? Authorizing Provider  ciprofloxacin (CIPRO) 500 MG tablet Take 1 tablet (500 mg total) by mouth 2 (two) times daily. One po bid x 7 days 05/17/13   Bethann BerkshireZammit, Joseph, MD  ibuprofen (ADVIL,MOTRIN) 800 MG tablet Take 1 tablet (800 mg total) by mouth 3 (three) times daily. 10/10/14   Fayrene Helperran, Bowie, PA-C  methocarbamol (ROBAXIN) 500 MG tablet Take 1 tablet (500 mg total) by mouth 2 (two) times daily. 10/10/14   Fayrene Helperran, Bowie, PA-C  oxyCODONE-acetaminophen (PERCOCET/ROXICET) 5-325 MG per tablet Take 1 tablet by mouth every 6 (six) hours as needed for pain. 05/17/13   Bethann BerkshireZammit, Joseph, MD  promethazine (PHENERGAN) 25 MG tablet Take 1  tablet (25 mg total) by mouth every 6 (six) hours as needed for nausea. 05/17/13   Bethann BerkshireZammit, Joseph, MD  tamsulosin (FLOMAX) 0.4 MG CAPS capsule Take 1 capsule (0.4 mg total) by mouth daily. 05/17/13   Bethann BerkshireZammit, Joseph, MD    Family History No family history on file.  Social History Social History   Tobacco Use  . Smoking status: Current Some Day Smoker  Substance Use Topics  . Alcohol use: Yes  . Drug use: No     Allergies   Penicillins   Review of Systems Review of Systems  Unable to perform ROS: Acuity of condition    Physical Exam Updated Vital Signs BP 121/77   Pulse 97   Temp (!) 95.6 F (35.3 C) (Temporal)   Resp 13   Ht 6' (1.829 m)   Wt 81.6 kg (180 lb)   SpO2 94%   BMI 24.41 kg/m   Physical Exam  Constitutional: He is oriented to person, place, and time. He appears well-developed and well-nourished. No distress.  Wet, pale. Confused.   HENT:  Head: Normocephalic and atraumatic.  Eyes: Conjunctivae and EOM are normal. No scleral icterus.  Neck: Normal range of motion.  Cardiovascular: Normal rate, regular rhythm and intact distal pulses.  Pulmonary/Chest: Effort normal. No respiratory distress.  Initial sats of 82%.  Now satting 98% on a nonrebreather.  Rhonchorus in R lung base. Chest expansion symmetric.  Musculoskeletal: Normal range of motion.  Neurological: He is alert and oriented to person, place,  and time. He exhibits normal muscle tone. Coordination normal.  GCS improved to 15 shortly after arrival.  Patient answering questions appropriately and following commands.  Moving all extremities spontaneously.  Skin: Skin is warm and dry. No rash noted. He is not diaphoretic. No erythema. No pallor.  Psychiatric: He has a normal mood and affect. His behavior is normal.  Nursing note and vitals reviewed.    ED Treatments / Results  Labs (all labs ordered are listed, but only abnormal results are displayed) Labs Reviewed  CBC WITH DIFFERENTIAL/PLATELET  - Abnormal; Notable for the following components:      Result Value   Monocytes Absolute 1.1 (*)    All other components within normal limits  COMPREHENSIVE METABOLIC PANEL - Abnormal; Notable for the following components:   Potassium 3.2 (*)    Glucose, Bld 160 (*)    Creatinine, Ser 1.30 (*)    Calcium 8.7 (*)    AST 47 (*)    ALT 48 (*)    All other components within normal limits  ETHANOL - Abnormal; Notable for the following components:   Alcohol, Ethyl (B) 224 (*)    All other components within normal limits  I-STAT VENOUS BLOOD GAS, ED - Abnormal; Notable for the following components:   pH, Ven 7.156 (*)    pCO2, Ven 60.8 (*)    pO2, Ven 52.0 (*)    Acid-base deficit 8.0 (*)    All other components within normal limits  I-STAT VENOUS BLOOD GAS, ED - Abnormal; Notable for the following components:   pH, Ven 7.157 (*)    pO2, Ven 52.0 (*)    Acid-base deficit 9.0 (*)    All other components within normal limits  I-STAT CG4 LACTIC ACID, ED - Abnormal; Notable for the following components:   Lactic Acid, Venous 2.34 (*)    All other components within normal limits  I-STAT ARTERIAL BLOOD GAS, ED - Abnormal; Notable for the following components:   pH, Arterial 7.175 (*)    pCO2 arterial 60.9 (*)    Acid-base deficit 7.0 (*)    All other components within normal limits  RAPID URINE DRUG SCREEN, HOSP PERFORMED  BLOOD GAS, VENOUS  I-STAT ARTERIAL BLOOD GAS, ED    EKG EKG Interpretation  Date/Time:  Tuesday March 17 2018 05:12:54 EDT Ventricular Rate:  98 PR Interval:    QRS Duration: 95 QT Interval:  351 QTC Calculation: 449 R Axis:   52 Text Interpretation:  Sinus rhythm Borderline repolarization abnormality Confirmed by Ross Marcus ([id number]) on 03/17/2018 5:48:06 AM Also confirmed by Ross Marcus ([id number]), editor Elita Quick [phone])  on 03/17/2018 6:36:48 AM   Radiology Dg Chest Portable 1 View  Result Date: 03/17/2018 CLINICAL DATA:  Intoxication and  unresponsiveness. Shortness of breath. EXAM: PORTABLE CHEST 1 VIEW COMPARISON:  None. FINDINGS: Normal heart size. There is diffuse moderate pulmonary edema. There is upper right lung consolidation which may indicate aspiration. No pneumothorax or sizable pleural effusion. IMPRESSION: Diffuse moderate pulmonary edema with right upper lobe consolidation, likely aspiration. Electronically Signed   By: Deatra Robinson M.D.   On: 03/17/2018 05:36    Procedures Procedures (including critical care time)  Medications Ordered in ED Medications  ondansetron (ZOFRAN) injection 4 mg (4 mg Intravenous Given 03/17/18 0508)  cefTRIAXone (ROCEPHIN) 2 g in sodium chloride 0.9 % 100 mL IVPB (2 g Intravenous New Bag/Given 03/17/18 0625)  metroNIDAZOLE (FLAGYL) IVPB 500 mg (500 mg Intravenous New Bag/Given 03/17/18 0705)  sodium chloride  0.9 % bolus 1,000 mL (1,000 mLs Intravenous New Bag/Given 03/17/18 0705)    5:57 AM Antibiotics ordered for concern for aspiration pneumonia on chest x-ray.  Patient understands need for admission.  He is agreeable to staying.  Attempted to wean off NRB, but slowly started to desat. Placed back on NRB with improvement in saturations.  7:10 AM GCS 15 on repeat assessment. Repeated gas. ABG with persistent acidosis. Will place on BiPAP.  7:15 AM Case discussed with TRH. Express concern about stability in step down given acidosis with aspiration, potential need for intubation. Will discuss case with PCCM.  8:06 AM Awaiting PCCM consultation. Paged x 3. Patient with sats of 98% on BiPAP, tolerating well.  8:26 AM Unable to get in contact with PCCM. Black box called to try and facilitate consultation.   CRITICAL CARE Performed by: Antony Madura   Total critical care time: 45 minutes  Critical care time was exclusive of separately billable procedures and treating other patients.  Critical care was necessary to treat or prevent imminent or life-threatening deterioration.  Critical  care was time spent personally by me on the following activities: development of treatment plan with patient and/or surrogate as well as nursing, discussions with consultants, evaluation of patient's response to treatment, examination of patient, obtaining history from patient or surrogate, ordering and performing treatments and interventions, ordering and review of laboratory studies, ordering and review of radiographic studies, pulse oximetry and re-evaluation of patient's condition.    Initial Impression / Assessment and Plan / ED Course  I have reviewed the triage vital signs and the nursing notes.  Pertinent labs & imaging results that were available during my care of the patient were reviewed by me and considered in my medical decision making (see chart for details).   27 year old male presents to the emergency department for altered mental status.  Found unresponsive at home by roommate.  Had been drinking earlier this evening.  There is concern for opiate overdose.  4 mg Narcan given prior to arrival without immediate improvement.  Patient was bagged in the field with sats of 98%.  Mentation improved after prolonged oxygenation.  Current GCS is 15.  Patient with alcohol of 224.  He has evidence of aspiration pneumonia on chest x-ray.  Rocephin and Flagyl given for aspiration pneumonia coverage.  Placed on BiPAP for persistent acidosis.  Previously attempted to wean off nonrebreather, but patient proceeded to desaturate.  Will require admission.   Final Clinical Impressions(s) / ED Diagnoses   Final diagnoses:  Acute respiratory failure with hypoxia (HCC)  Aspiration pneumonia of right lung, unspecified aspiration pneumonia type, unspecified part of lung (HCC)  AKI (acute kidney injury) (HCC)  Acute alcoholic intoxication with complication [facility])    ED Discharge Orders    None       Antony Madura, PA-C 03/17/18 9147    Shon Baton, MD 03/18/18 [phone]

## 2018-03-17 NOTE — ED Notes (Signed)
LEO give RN pt's ID and note from "Casimiro NeedleMichael" who told him to call and he could come pick him up.  Note and objects placed in pt's pocket.  Casimiro NeedleMichael -- [phone]

## 2018-03-17 NOTE — H&P (Signed)
PULMONARY / CRITICAL CARE MEDICINE   Name: Jason Sosa MRN: [id number] DOB: June 18, 1991    ADMISSION DATE:  03/17/2018 CONSULTATION DATE:  Leamon Arnt  REFERRING MD:  Dr. Lynelle Doctor EDP  CHIEF COMPLAINT:  AMS, alcohol intoxication  HISTORY OF PRESENT ILLNESS:   27 year old male with no significant past medical history found unresponsive in the shower after night of drinking.  There is also concern for medication overdose, however, he had minimal response to Narcan despite having oxycodone and his medication list.  Upon EMS arrival to the scene oxygen saturations of 70% on room air were noted.  He was bagged in route to the ED with sats improved to 98%.  Upon arrival to the emergency department he was minimally more responsive and able to state that he had been drinking alcohol but unable to give any specifics.  He denies use of any other illicit substances.  Reportedly he "takes morphine sometimes ".  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  Treatment in the emergency department included supplemental oxygen via nonrebreather, and then eventually BiPAP   After he was found to have a respiratory acidosis.  This has been improved with BiPAP.  He is also treated with ceftriaxone and Flagyl for aspiration pneumonia which is demonstrated on chest x-ray. PCCM has been asked to admit.   PAST MEDICAL HISTORY :  He  has no past medical history on file.  PAST SURGICAL HISTORY: He  has no past surgical history on file.  Allergies  Allergen Reactions  . Penicillins Hives    No current facility-administered medications on file prior to encounter.    Current Outpatient Medications on File Prior to Encounter  Medication Sig  . ciprofloxacin (CIPRO) 500 MG tablet Take 1 tablet (500 mg total) by mouth 2 (two) times daily. One po bid x 7 days  . ibuprofen (ADVIL,MOTRIN) 800 MG tablet Take 1 tablet (800 mg total) by mouth 3 (three) times daily.  .  methocarbamol (ROBAXIN) 500 MG tablet Take 1 tablet (500 mg total) by mouth 2 (two) times daily.  Marland Kitchen oxyCODONE-acetaminophen (PERCOCET/ROXICET) 5-325 MG per tablet Take 1 tablet by mouth every 6 (six) hours as needed for pain.  . promethazine (PHENERGAN) 25 MG tablet Take 1 tablet (25 mg total) by mouth every 6 (six) hours as needed for nausea.  . tamsulosin (FLOMAX) 0.4 MG CAPS capsule Take 1 capsule (0.4 mg total) by mouth daily.    FAMILY HISTORY:  His has no family status information on file.    SOCIAL HISTORY: He  reports that he has been smoking.  He does not have any smokeless tobacco history on file. He reports that he drinks alcohol. He reports that he does not use drugs.  REVIEW OF SYSTEMS:   Unable due to encephalopathy  SUBJECTIVE:    VITAL SIGNS: BP (!) 102/56   Pulse 89   Temp (!) 95.6 F (35.3 C) (Temporal)   Resp 14   Ht 6' (1.829 m)   Wt 81.6 kg (180 lb)   SpO2 93%   BMI 24.41 kg/m   HEMODYNAMICS:    VENTILATOR SETTINGS: Vent Mode: PCV FiO2 (%):  [50 %] 50 % Set Rate:  [18 bmp] 18 bmp PEEP:  [5 cmH20] 5 cmH20  INTAKE / OUTPUT: No intake/output data recorded.  PHYSICAL EXAMINATION: General:  Young adult male of normal body habitus in NAD Neuro:  RASS -1, able to answer some simple  questions before falling back asleep.  HEENT:  Vander/AT, PERRL, no JVD Cardiovascular:  RRR, no MRG Lungs:  Diminished R, clear L Abdomen:  Soft, non-tender, non-distended Musculoskeletal:  No acute deformity Skin:  Grossly intact.   LABS:  BMET Recent Labs  Lab 03/17/18 0528  NA 137  K 3.2*  CL 101  CO2 23  BUN 8  CREATININE 1.30*  GLUCOSE 160*    Electrolytes Recent Labs  Lab 03/17/18 0528  CALCIUM 8.7*    CBC Recent Labs  Lab 03/17/18 0528  WBC 8.6  HGB 16.0  HCT 49.4  PLT 275    Coag's No results for input(s): APTT, INR in the last 168 hours.  Sepsis Markers Recent Labs  Lab 03/17/18 0708  LATICACIDVEN 2.34*    ABG Recent Labs   Lab 03/17/18 0709  PHART 7.175*  PCO2ART 60.9*  PO2ART 85.0    Liver Enzymes Recent Labs  Lab 03/17/18 0528  AST 47*  ALT 48*  ALKPHOS 106  BILITOT 0.6  ALBUMIN 4.0    Cardiac Enzymes No results for input(s): TROPONINI, PROBNP in the last 168 hours.  Glucose No results for input(s): GLUCAP in the last 168 hours.  Imaging Dg Chest Portable 1 View  Result Date: 03/17/2018 CLINICAL DATA:  Intoxication and unresponsiveness. Shortness of breath. EXAM: PORTABLE CHEST 1 VIEW COMPARISON:  None. FINDINGS: Normal heart size. There is diffuse moderate pulmonary edema. There is upper right lung consolidation which may indicate aspiration. No pneumothorax or sizable pleural effusion. IMPRESSION: Diffuse moderate pulmonary edema with right upper lobe consolidation, likely aspiration. Electronically Signed   By: Deatra RobinsonKevin  Herman M.D.   On: 03/17/2018 05:36     STUDIES:    CULTURES:   ANTIBIOTICS: Ceftriaxone 7/2 > Metronidazole 7/2 >  SIGNIFICANT EVENTS: 7/2 admit  LINES/TUBES:   DISCUSSION: 27 year old male with some substance abuse history (ETOH, morhpine "sometimes" and has injected opioids in the past) found down at home minimally responsive and hypoxic. No response to morphine. Somewhat more awake after BVM ventilations and started on BiPAP. Treated with ABX for aspiration and PCCM asked to admit.   ASSESSMENT / PLAN:  Acute metabolic and toxic encephalopathy in the setting of respiratory acidosis due to alcohol intoxication.  - Continue BiPAP for now - Low threshold to intubate with slowly improving ABG, aspiration PNA, and borderline mental status.  - IVF, thiamine, folate.  - Repeat ABG 1 hour.   Acute hypoxemic and hypercarbic respiratory failure.  Aspiration PNA - Continue BiPAP for now, settings adjusted to facilitate ventilation. May need intubation as above. - Ceftriaxone and metronidazole continue. PCN allergic.   Substance abuse definitely alcohol  intoxication, but perhaps some other substances as well, he is unsure. ETOH level 224. - UDS pending - IVF - No response to narcan.  AKI - hydrate - Repeat BMP  Lactic acid elevated - repeat, suspect this will have cleared.   Joneen RoachPaul [place], AGACNP-BC [facility] Pager [phone] or [phone]  03/17/2018 9:49 AM

## 2018-03-17 NOTE — Progress Notes (Signed)
Patient taken off bipap at 13:00. At 13:03 patient vomited large amount without signs of aspiration. Order for zofran received and given.

## 2018-03-17 NOTE — ED Notes (Signed)
Dr. Wilkie AyeHorton notified of lactic

## 2018-03-18 ENCOUNTER — Other Ambulatory Visit: Payer: Self-pay

## 2018-03-18 ENCOUNTER — Inpatient Hospital Stay (HOSPITAL_COMMUNITY): Payer: Self-pay

## 2018-03-18 ENCOUNTER — Encounter (HOSPITAL_COMMUNITY): Payer: Self-pay

## 2018-03-18 DIAGNOSIS — E872 Acidosis: Secondary | ICD-10-CM

## 2018-03-18 DIAGNOSIS — F1910 Other psychoactive substance abuse, uncomplicated: Secondary | ICD-10-CM

## 2018-03-18 DIAGNOSIS — R0789 Other chest pain: Secondary | ICD-10-CM

## 2018-03-18 LAB — CBC
HEMATOCRIT: 39.8 % (ref 39.0–52.0)
Hemoglobin: 13.1 g/dL (ref 13.0–17.0)
MCH: 29.8 pg (ref 26.0–34.0)
MCHC: 32.9 g/dL (ref 30.0–36.0)
MCV: 90.5 fL (ref 78.0–100.0)
Platelets: 209 10*3/uL (ref 150–400)
RBC: 4.4 MIL/uL (ref 4.22–5.81)
RDW: 12.9 % (ref 11.5–15.5)
WBC: 10.6 10*3/uL — AB (ref 4.0–10.5)

## 2018-03-18 LAB — BASIC METABOLIC PANEL
ANION GAP: 6 (ref 5–15)
BUN: 7 mg/dL (ref 6–20)
CALCIUM: 8.4 mg/dL — AB (ref 8.9–10.3)
CHLORIDE: 103 mmol/L (ref 98–111)
CO2: 26 mmol/L (ref 22–32)
Creatinine, Ser: 0.88 mg/dL (ref 0.61–1.24)
GFR calc Af Amer: >60 mL/min (ref >60)
GFR calc non Af Amer: >60 mL/min (ref >60)
GLUCOSE: 90 mg/dL (ref 70–99)
Potassium: 4.1 mmol/L (ref 3.5–5.1)
Sodium: 135 mmol/L (ref 135–145)

## 2018-03-18 LAB — PHOSPHORUS: Phosphorus: 2.4 mg/dL — ABNORMAL LOW (ref 2.5–4.6)

## 2018-03-18 LAB — HIV ANTIBODY (ROUTINE TESTING W REFLEX): HIV Screen 4th Generation wRfx: NONREACTIVE

## 2018-03-18 LAB — MAGNESIUM: Magnesium: 2.2 mg/dL (ref 1.7–2.4)

## 2018-03-18 MED ORDER — LORAZEPAM 2 MG/ML IJ SOLN
2.0000 mg | INTRAMUSCULAR | Status: DC | PRN
  Administered 2018-03-18: 2 mg via INTRAVENOUS
  Filled 2018-03-18: qty 1

## 2018-03-18 MED ORDER — THIAMINE HCL 100 MG/ML IJ SOLN
100.0000 mg | Freq: Every day | INTRAMUSCULAR | Status: DC
  Administered 2018-03-19 – 2018-03-20 (×2): 100 mg via INTRAVENOUS
  Filled 2018-03-18 (×2): qty 2

## 2018-03-18 MED ORDER — IPRATROPIUM-ALBUTEROL 0.5-2.5 (3) MG/3ML IN SOLN
3.0000 mL | Freq: Two times a day (BID) | RESPIRATORY_TRACT | Status: DC
  Administered 2018-03-19 – 2018-03-20 (×3): 3 mL via RESPIRATORY_TRACT
  Filled 2018-03-18 (×3): qty 3

## 2018-03-18 MED ORDER — DM-GUAIFENESIN ER 30-600 MG PO TB12
1.0000 | ORAL_TABLET | Freq: Two times a day (BID) | ORAL | Status: DC
  Administered 2018-03-18 – 2018-03-20 (×4): 1 via ORAL
  Filled 2018-03-18 (×4): qty 1

## 2018-03-18 MED ORDER — IPRATROPIUM-ALBUTEROL 0.5-2.5 (3) MG/3ML IN SOLN
3.0000 mL | Freq: Four times a day (QID) | RESPIRATORY_TRACT | Status: DC
  Administered 2018-03-18: 3 mL via RESPIRATORY_TRACT

## 2018-03-18 MED ORDER — FOLIC ACID 1 MG PO TABS
1.0000 mg | ORAL_TABLET | Freq: Every day | ORAL | Status: DC
  Administered 2018-03-18 – 2018-03-20 (×3): 1 mg via ORAL
  Filled 2018-03-18 (×3): qty 1

## 2018-03-18 NOTE — Progress Notes (Signed)
Called to room due to patient c/o chest pain. Patient states that the pain comes with deep breathes and pressing on chest. No other complaints. EKG done and shows NSR. MD notified with no new orders. Will continue to monitor.

## 2018-03-18 NOTE — Progress Notes (Signed)
PROGRESS NOTE    Jason Sosa  ZOX:[id number] DOB: [birth year] DOA: 03/17/2018 PCP: Patient, No Pcp Per   Brief Narrative:  27 year old WM PMHx EtOH abuse, Polysubstance (cocaine, injected opioids).  Found unresponsive in the shower after night of drinking.  There is also concern for medication overdose, however, he had minimal response to Narcan despite having oxycodone and his medication list.  Upon EMS arrival to the scene oxygen saturations of 70% on room air were noted.  He was bagged in route to the ED with sats improved to 98%.  Upon arrival to the emergency department he was minimally more responsive and able to state that he had been drinking alcohol but unable to give any specifics.  He denies use of any other illicit substances.  Reportedly he "takes morphine sometimes ".  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   Treatment in the emergency department included supplemental oxygen via nonrebreather, and then eventually BiPAP   After he was found to have a respiratory acidosis.  This has been improved with BiPAP.  He is also treated with ceftriaxone and Flagyl for aspiration pneumonia which is demonstrated on chest x-ray. PCCM has been asked to admit.     Subjective: 7/3 A/O x4, negative CP, negative S OB, negative abdominal pain.  States this is the first time he has OD.   Assessment & Plan:   Active Problems:   Accidental ETOH poisoning (HCC)   Alcohol intoxication (HCC)  Acute metabolic encephalopathy - Secondary to EtOH abuse, substance abuse, hypoxia - Chest pain troponins not obtained at admission.  ACS ?   Acute respiratory failure with hypoxia and hypercapnia  -See metabolic encephalopathy  Aspiration PNA -Complete course antibiotics -DuoNeb QID -Flutter valve -Mucinex DM  EtOH abuse/polysubstance abuse -Upon admission EtOH= 224 -UDS positive opiates, cocaine -CIWA protocol -  Chest  pain -Troponins not obtained at admission -EKG NSR -Echocardiogram pending   Acute kidney injury Recent Labs  Lab 03/17/18 0528 03/18/18 0321  CREATININE 1.30* 0.88  -Resolved -Normal saline 61ml/hr   Lactic acid elevated -Most likely will have resolved -  Pending for 7/4    DVT prophylaxis: Lovenox Code Status: Full Family Communication: None Disposition Plan: TVD   Consultants:  PCCM    Procedures/Significant Events:  Echocardiogram pending     I have personally reviewed and interpreted all radiology studies and my findings are as above.  VENTILATOR SETTINGS:    Cultures   Antimicrobials: Anti-infectives (From admission, onward)   Start     Stop   03/18/18 0600  cefTRIAXone (ROCEPHIN) 2 g in sodium chloride 0.9 % 100 mL IVPB         03/17/18 1500  metroNIDAZOLE (FLAGYL) IVPB 500 mg         03/17/18 0600  clindamycin (CLEOCIN) IVPB 600 mg  Status:  Discontinued     03/17/18 0550   03/17/18 0600  cefTRIAXone (ROCEPHIN) 2 g in sodium chloride 0.9 % 100 mL IVPB     03/17/18 0846   03/17/18 0600  metroNIDAZOLE (FLAGYL) IVPB 500 mg     03/17/18 0805       Devices    LINES / TUBES:      Continuous Infusions: . sodium chloride Stopped (03/17/18 1527)  . sodium chloride 75 mL/hr at 03/17/18 2238  . sodium chloride Stopped (03/17/18 1736)  . cefTRIAXone (ROCEPHIN)  IV 2 g (03/18/18 0519)  . famotidine (PEPCID)  IV Stopped (03/18/18 0700)  . metronidazole 500 mg (03/18/18 0606)     Objective: Vitals:   03/17/18 2215 03/18/18 0302 03/18/18 0313 03/18/18 0400  BP: 131/76  118/76   Pulse: 82  82 81  Resp: 16  14 20   Temp: 98.9 F (37.2 C)  98.8 F (37.1 C)   TempSrc: Oral  Oral   SpO2: 94%  94% 93%  Weight: 228 lb 9.9 oz (103.7 kg) 228 lb 9.9 oz (103.7 kg)    Height: 6' (1.829 m)       Intake/Output Summary (Last 24 hours) at 03/18/2018 0800 Last data filed at 03/18/2018 0600 Gross per 24 hour  Intake 2740.42 ml  Output 2950 ml  Net  -209.58 ml   Filed Weights   03/17/18 0514 03/17/18 2215 03/18/18 0302  Weight: 180 lb (81.6 kg) 228 lb 9.9 oz (103.7 kg) 228 lb 9.9 oz (103.7 kg)    Examination:  General: A/Ox 4 No acute respiratory distress Neck:  Negative scars, masses, torticollis, lymphadenopathy, JVD Lungs: Clear to auscultation bilaterally, bibasilar crackles, negative wheeze Cardiovascular: Tachycardic, regular rhythm without murmur gallop or rub normal S1 and S2 Abdomen: negative abdominal pain, nondistended, positive soft, bowel sounds, no rebound, no ascites, no appreciable mass Extremities: No significant cyanosis, clubbing, or edema bilateral lower extremities Skin: Negative rashes, lesions, ulcers Psychiatric:  Negative depression, negative anxiety, negative fatigue, negative mania  Central nervous system:  Cranial nerves II through XII intact, tongue/uvula midline, all extremities muscle strength 5/5, sensation intact throughout, negative dysarthria, negative expressive aphasia, negative receptive aphasia.  .     Data Reviewed: Care during the described time interval was provided by me .  I have reviewed this patient's available data, including medical history, events of note, physical examination, and all test results as part of my evaluation.   CBC: Recent Labs  Lab 03/17/18 0528 03/18/18 0321  WBC 8.6 10.6*  NEUTROABS 3.7  --   HGB 16.0 13.1  HCT 49.4 39.8  MCV 91.5 90.5  PLT 275 209   Basic Metabolic Panel: Recent Labs  Lab 03/17/18 0528 03/18/18 0321  NA 137 135  K 3.2* 4.1  CL 101 103  CO2 23 26  GLUCOSE 160* 90  BUN 8 7  CREATININE 1.30* 0.88  CALCIUM 8.7* 8.4*  MG  --  2.2  PHOS  --  2.4*   GFR: Estimated Creatinine Clearance: 156.9 mL/min (by C-G formula based on SCr of 0.88 mg/dL). Liver Function Tests: Recent Labs  Lab 03/17/18 0528  AST 47*  ALT 48*  ALKPHOS 106  BILITOT 0.6  PROT 7.4  ALBUMIN 4.0   No results for input(s): LIPASE, AMYLASE in the last 168  hours. No results for input(s): AMMONIA in the last 168 hours. Coagulation Profile: No results for input(s): INR, PROTIME in the last 168 hours. Cardiac Enzymes: No results for input(s): CKTOTAL, CKMB, CKMBINDEX, TROPONINI in the last 168 hours. BNP (last 3 results) No results for input(s): PROBNP in the last 8760 hours. HbA1C: No results for input(s): HGBA1C in the last 72 hours. CBG: Recent Labs  Lab 03/17/18 1146  GLUCAP 90   Lipid Profile: No results for input(s): CHOL, HDL, LDLCALC, TRIG, CHOLHDL, LDLDIRECT in the last 72 hours. Thyroid Function Tests: No results for input(s): TSH, T4TOTAL, FREET4, T3FREE, THYROIDAB in the last 72 hours. Anemia Panel: No results for input(s): VITAMINB12, FOLATE, FERRITIN, TIBC, IRON, RETICCTPCT in the last 72 hours. Urine analysis:    Component Value Date/Time  COLORURINE AMBER (A) 05/17/2013 2059   APPEARANCEUR CLOUDY (A) 05/17/2013 2059   LABSPEC 1.031 (H) 05/17/2013 2059   PHURINE 6.0 05/17/2013 2059   GLUCOSEU NEGATIVE 05/17/2013 2059   HGBUR LARGE (A) 05/17/2013 2059   BILIRUBINUR SMALL (A) 05/17/2013 2059   KETONESUR NEGATIVE 05/17/2013 2059   PROTEINUR 30 (A) 05/17/2013 2059   UROBILINOGEN 0.2 05/17/2013 2059   NITRITE NEGATIVE 05/17/2013 2059   LEUKOCYTESUR NEGATIVE 05/17/2013 2059   Sepsis Labs: @LABRCNTIP (procalcitonin:4,lacticidven:4)  ) Recent Results (from the past 240 hour(s))  MRSA PCR Screening     Status: None   Collection Time: 03/17/18 11:46 AM  Result Value Ref Range Status   MRSA by PCR NEGATIVE NEGATIVE Final    Comment:        The GeneXpert MRSA Assay (FDA approved for NASAL specimens only), is one component of a comprehensive MRSA colonization surveillance program. It is not intended to diagnose MRSA infection nor to guide or monitor treatment for MRSA infections. Performed at [facility], [address] [place], [place] [id number]          Radiology Studies: Dg Chest Port 1  View  Result Date: 03/18/2018 CLINICAL DATA:  Aspiration, shortness of breath. EXAM: PORTABLE CHEST 1 VIEW COMPARISON:  Radiograph of March 17, 2018. FINDINGS: Cardiac size is within normal limits. Central pulmonary vascular congestion is noted. Right upper lobe airspace opacity has resolved. No pneumothorax or pleural effusion is noted. The visualized skeletal structures are unremarkable. IMPRESSION: Right upper lobe airspace opacity noted on prior exam has resolved. Central pulmonary vascular congestion is noted. Electronically Signed   By: Lupita RaiderJames  Green Jr, M.D.   On: 03/18/2018 07:47   Dg Chest Portable 1 View  Result Date: 03/17/2018 CLINICAL DATA:  Intoxication and unresponsiveness. Shortness of breath. EXAM: PORTABLE CHEST 1 VIEW COMPARISON:  None. FINDINGS: Normal heart size. There is diffuse moderate pulmonary edema. There is upper right lung consolidation which may indicate aspiration. No pneumothorax or sizable pleural effusion. IMPRESSION: Diffuse moderate pulmonary edema with right upper lobe consolidation, likely aspiration. Electronically Signed   By: Deatra RobinsonKevin  Herman M.D.   On: 03/17/2018 05:36        Scheduled Meds: . albuterol  2.5 mg Nebulization TID  . chlorhexidine  15 mL Mouth Rinse BID  . enoxaparin (LOVENOX) injection  40 mg Subcutaneous Q24H  . mouth rinse  15 mL Mouth Rinse q12n4p  . thiamine injection  100 mg Intravenous Daily   Continuous Infusions: . sodium chloride Stopped (03/17/18 1527)  . sodium chloride 75 mL/hr at 03/17/18 2238  . sodium chloride Stopped (03/17/18 1736)  . cefTRIAXone (ROCEPHIN)  IV 2 g (03/18/18 0519)  . famotidine (PEPCID) IV Stopped (03/18/18 0700)  . metronidazole 500 mg (03/18/18 0606)     LOS: 1 day    Time spent: 40 minutes    WOODS, Roselind MessierURTIS J, MD [facility] Pager [phone]   If 7PM-7AM, please contact night-coverage [email or web address] Password [email or web address] 03/18/2018, 8:00 AM

## 2018-03-18 NOTE — Care Management Note (Signed)
Case Management Note  Patient Details  Name: Lucinda DellBrian Erdmann MRN: [id number] Date of Birth: 05-Dec-1990  Subjective/Objective:     From home, acute resp failure/acute resp acidosis related to drug/etoh od with asp pna, psa, aki, he has no insurance or pcp, NCM scheduled f/u apt [facility] on [facility]/1 at 8:50 am, also gave patient a Match letter for medication assistance.                Action/Plan: DC when ready.  Expected Discharge Date:                  Expected Discharge Plan:  Home/Self Care  In-House Referral:     Discharge planning Services  CM Consult, [facility], [facility], Follow-up appt scheduled, Medication Assistance  Post Acute Care Choice:    Choice offered to:     DME Arranged:    DME Agency:     HH Arranged:    HH Agency:     Status of Service:  Completed, signed off  If discussed at [employer] Meetings, dates discussed:    Additional Comments:  Leone Havenaylor, Takumi Din Clinton, RN 03/18/2018, 2:20 PM

## 2018-03-19 ENCOUNTER — Inpatient Hospital Stay (HOSPITAL_COMMUNITY): Payer: Self-pay

## 2018-03-19 DIAGNOSIS — R079 Chest pain, unspecified: Secondary | ICD-10-CM

## 2018-03-19 LAB — COMPREHENSIVE METABOLIC PANEL
ALK PHOS: 80 U/L (ref 38–126)
ALT: 28 U/L (ref 0–44)
AST: 27 U/L (ref 15–41)
Albumin: 3.4 g/dL — ABNORMAL LOW (ref 3.5–5.0)
Anion gap: 8 (ref 5–15)
BILIRUBIN TOTAL: 0.9 mg/dL (ref 0.3–1.2)
CALCIUM: 8.5 mg/dL — AB (ref 8.9–10.3)
CHLORIDE: 104 mmol/L (ref 98–111)
CO2: 24 mmol/L (ref 22–32)
CREATININE: 0.86 mg/dL (ref 0.61–1.24)
GFR calc Af Amer: >60 mL/min (ref >60)
Glucose, Bld: 122 mg/dL — ABNORMAL HIGH (ref 70–99)
Potassium: 3.5 mmol/L (ref 3.5–5.1)
Sodium: 136 mmol/L (ref 135–145)
TOTAL PROTEIN: 6.3 g/dL — AB (ref 6.5–8.1)

## 2018-03-19 LAB — ECHOCARDIOGRAM COMPLETE
HEIGHTINCHES: 72 in
WEIGHTICAEL: 3576.74 [oz_av]

## 2018-03-19 LAB — PHOSPHORUS: PHOSPHORUS: 1.3 mg/dL — AB (ref 2.5–4.6)

## 2018-03-19 LAB — MAGNESIUM: Magnesium: 2.1 mg/dL (ref 1.7–2.4)

## 2018-03-19 MED ORDER — ACETAMINOPHEN 325 MG PO TABS
650.0000 mg | ORAL_TABLET | Freq: Four times a day (QID) | ORAL | Status: DC | PRN
  Administered 2018-03-19: 650 mg via ORAL
  Filled 2018-03-19: qty 2

## 2018-03-19 MED ORDER — FAMOTIDINE 20 MG PO TABS
20.0000 mg | ORAL_TABLET | Freq: Two times a day (BID) | ORAL | Status: DC
  Administered 2018-03-19 – 2018-03-20 (×2): 20 mg via ORAL
  Filled 2018-03-19 (×2): qty 1

## 2018-03-19 MED ORDER — SODIUM PHOSPHATES 45 MMOLE/15ML IV SOLN
30.0000 mmol | Freq: Once | INTRAVENOUS | Status: AC
  Administered 2018-03-19: 30 mmol via INTRAVENOUS
  Filled 2018-03-19: qty 10

## 2018-03-19 NOTE — Progress Notes (Signed)
  Echocardiogram 2D Echocardiogram has been performed.  Jason Sosa Jason Sosa 03/19/2018, 11:38 AM

## 2018-03-19 NOTE — Progress Notes (Signed)
PROGRESS NOTE    Jason DellBrian Sosa  ZOX:[id number] DOB: [birth year] DOA: 03/17/2018 PCP: Patient, No Pcp Per   Brief Narrative:  27 year old WM PMHx EtOH abuse, Polysubstance (cocaine, injected opioids).  Found unresponsive in the shower after night of drinking.  There is also concern for medication overdose, however, he had minimal response to Narcan despite having oxycodone and his medication list.  Upon EMS arrival to the scene oxygen saturations of 70% on room air were noted.  He was bagged in route to the ED with sats improved to 98%.  Upon arrival to the emergency department he was minimally more responsive and able to state that he had been drinking alcohol but unable to give any specifics.  He denies use of any other illicit substances.  Reportedly he "takes morphine sometimes ".  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   Treatment in the emergency department included supplemental oxygen via nonrebreather, and then eventually BiPAP   After he was found to have a respiratory acidosis.  This has been improved with BiPAP.  He is also treated with ceftriaxone and Flagyl for aspiration pneumonia which is demonstrated on chest x-ray. PCCM has been asked to admit.     Subjective: 7/4 A/O x4, negative CP, negative S OB.  Advance diet--> N/V   Assessment & Plan:   Active Problems:   Accidental ETOH poisoning (HCC)   Alcohol intoxication (HCC)  Acute metabolic encephalopathy - Secondary to EtOH abuse, substance abuse, hypoxia - Resolved   Acute respiratory failure with hypoxia and hypercapnia  -Secondary to pneumonia. - Significantly improved.   Aspiration PNA -Complete course antibiotics -DuoNeb QID -Flutter valve -Mucinex DM -Patient with episode of N/V therefore unable to transition from IV antibiotic--PO.  In the a.m. if patient able to keep p.o. food/fluids down will transition to p.o. Augmentin to complete 7-day  course..  EtOH abuse/polysubstance abuse -Upon admission EtOH= 224 -UDS positive opiates, cocaine -CIWA protocol  Chest pain -Troponins not obtained at admission -EKG NSR -Echocardiogram WNL see results below   Acute kidney injury Recent Labs  Lab 03/17/18 0528 03/18/18 0321 03/19/18 0150  CREATININE 1.30* 0.88 0.86  -Resolved    Lactic acid elevated -Resolved   Hypophosphatemia - Sodium phosphate IV 30 mmol    DVT prophylaxis: Lovenox Code Status: Full Family Communication: None Disposition Plan: TVD   Consultants:  PCCM    Procedures/Significant Events:  7/4 echocardiogram:Left ventricle: LVEF = 60% to 65%.     I have personally reviewed and interpreted all radiology studies and my findings are as above.  VENTILATOR SETTINGS:    Cultures   Antimicrobials: Anti-infectives (From admission, onward)   Start     Stop   03/18/18 0600  cefTRIAXone (ROCEPHIN) 2 g in sodium chloride 0.9 % 100 mL IVPB         03/17/18 1500  metroNIDAZOLE (FLAGYL) IVPB 500 mg         03/17/18 0600  clindamycin (CLEOCIN) IVPB 600 mg  Status:  Discontinued     03/17/18 0550   03/17/18 0600  cefTRIAXone (ROCEPHIN) 2 g in sodium chloride 0.9 % 100 mL IVPB     03/17/18 0846   03/17/18 0600  metroNIDAZOLE (FLAGYL) IVPB 500 mg     03/17/18 0805       Devices    LINES / TUBES:      Continuous Infusions: . sodium chloride Stopped (03/17/18 1527)  .  sodium chloride 75 mL/hr at 03/19/18 0123  . sodium chloride Stopped (03/17/18 1736)  . cefTRIAXone (ROCEPHIN)  IV 2 g (03/19/18 0626)  . famotidine (PEPCID) IV 20 mg (03/18/18 2120)  . metronidazole 500 mg (03/18/18 2333)     Objective: Vitals:   03/19/18 0424 03/19/18 0426 03/19/18 0728 03/19/18 0731  BP: 118/74  125/76   Pulse: 90  73   Resp: (!) 22  (!) 23   Temp: 98.4 F (36.9 C)  98.6 F (37 C)   TempSrc: Oral   Oral  SpO2: 95%  96% 97%  Weight:  223 lb 8.7 oz (101.4 kg)    Height:         Intake/Output Summary (Last 24 hours) at 03/19/2018 1610 Last data filed at 03/19/2018 9604 Gross per 24 hour  Intake 3296.11 ml  Output 3625 ml  Net -328.89 ml   Filed Weights   03/17/18 2215 03/18/18 0302 03/19/18 0426  Weight: 228 lb 9.9 oz (103.7 kg) 228 lb 9.9 oz (103.7 kg) 223 lb 8.7 oz (101.4 kg)    Physical Exam:  General: A/O x4 No acute respiratory distress Neck:  Negative scars, masses, torticollis, lymphadenopathy, JVD Lungs: Clear to auscultation bilaterally without wheezes or crackles Cardiovascular: Regular rate and rhythm without murmur gallop or rub normal S1 and S2 Abdomen: negative abdominal pain, nondistended, positive soft, bowel sounds, no rebound, no ascites, no appreciable mass Extremities: No significant cyanosis, clubbing, or edema bilateral lower extremities Skin: Negative rashes, lesions, ulcers Psychiatric:  Negative depression, negative anxiety, negative fatigue, negative mania  Central nervous system:  Cranial nerves II through XII intact, tongue/uvula midline, all extremities muscle strength 5/5, sensation intact throughout,  negative dysarthria, negative expressive aphasia, negative receptive aphasia.  .     Data Reviewed: Care during the described time interval was provided by me .  I have reviewed this patient's available data, including medical history, events of note, physical examination, and all test results as part of my evaluation.   CBC: Recent Labs  Lab 03/17/18 0528 03/18/18 0321  WBC 8.6 10.6*  NEUTROABS 3.7  --   HGB 16.0 13.1  HCT 49.4 39.8  MCV 91.5 90.5  PLT 275 209   Basic Metabolic Panel: Recent Labs  Lab 03/17/18 0528 03/18/18 0321 03/19/18 0150  NA 137 135 136  K 3.2* 4.1 3.5  CL 101 103 104  CO2 23 26 24   GLUCOSE 160* 90 122*  BUN 8 7 <5*  CREATININE 1.30* 0.88 0.86  CALCIUM 8.7* 8.4* 8.5*  MG  --  2.2 2.1  PHOS  --  2.4* 1.3*   GFR: Estimated Creatinine Clearance: 159 mL/min (by C-G formula based on  SCr of 0.86 mg/dL). Liver Function Tests: Recent Labs  Lab 03/17/18 0528 03/19/18 0150  AST 47* 27  ALT 48* 28  ALKPHOS 106 80  BILITOT 0.6 0.9  PROT 7.4 6.3*  ALBUMIN 4.0 3.4*   No results for input(s): LIPASE, AMYLASE in the last 168 hours. No results for input(s): AMMONIA in the last 168 hours. Coagulation Profile: No results for input(s): INR, PROTIME in the last 168 hours. Cardiac Enzymes: No results for input(s): CKTOTAL, CKMB, CKMBINDEX, TROPONINI in the last 168 hours. BNP (last 3 results) No results for input(s): PROBNP in the last 8760 hours. HbA1C: No results for input(s): HGBA1C in the last 72 hours. CBG: Recent Labs  Lab 03/17/18 1146  GLUCAP 90   Lipid Profile: No results for input(s): CHOL, HDL, LDLCALC, TRIG, CHOLHDL,  LDLDIRECT in the last 72 hours. Thyroid Function Tests: No results for input(s): TSH, T4TOTAL, FREET4, T3FREE, THYROIDAB in the last 72 hours. Anemia Panel: No results for input(s): VITAMINB12, FOLATE, FERRITIN, TIBC, IRON, RETICCTPCT in the last 72 hours. Urine analysis:    Component Value Date/Time   COLORURINE AMBER (A) 05/17/2013 2059   APPEARANCEUR CLOUDY (A) 05/17/2013 2059   LABSPEC 1.031 (H) 05/17/2013 2059   PHURINE 6.0 05/17/2013 2059   GLUCOSEU NEGATIVE 05/17/2013 2059   HGBUR LARGE (A) 05/17/2013 2059   BILIRUBINUR SMALL (A) 05/17/2013 2059   KETONESUR NEGATIVE 05/17/2013 2059   PROTEINUR 30 (A) 05/17/2013 2059   UROBILINOGEN 0.2 05/17/2013 2059   NITRITE NEGATIVE 05/17/2013 2059   LEUKOCYTESUR NEGATIVE 05/17/2013 2059   Sepsis Labs: @LABRCNTIP (procalcitonin:4,lacticidven:4)  ) Recent Results (from the past 240 hour(s))  MRSA PCR Screening     Status: None   Collection Time: 03/17/18 11:46 AM  Result Value Ref Range Status   MRSA by PCR NEGATIVE NEGATIVE Final    Comment:        The GeneXpert MRSA Assay (FDA approved for NASAL specimens only), is one component of a comprehensive MRSA colonization surveillance  program. It is not intended to diagnose MRSA infection nor to guide or monitor treatment for MRSA infections. Performed at [facility], [address], [place], [place] [address]          Radiology Studies: Dg Chest Port 1 View  Result Date: 03/18/2018 CLINICAL DATA:  Aspiration, shortness of breath. EXAM: PORTABLE CHEST 1 VIEW COMPARISON:  Radiograph of March 17, 2018. FINDINGS: Cardiac size is within normal limits. Central pulmonary vascular congestion is noted. Right upper lobe airspace opacity has resolved. No pneumothorax or pleural effusion is noted. The visualized skeletal structures are unremarkable. IMPRESSION: Right upper lobe airspace opacity noted on prior exam has resolved. Central pulmonary vascular congestion is noted. Electronically Signed   By: Lupita Raider, M.D.   On: 03/18/2018 07:47        Scheduled Meds: . chlorhexidine  15 mL Mouth Rinse BID  . dextromethorphan-guaiFENesin  1 tablet Oral BID  . enoxaparin (LOVENOX) injection  40 mg Subcutaneous Q24H  . folic acid  1 mg Oral Daily  . ipratropium-albuterol  3 mL Nebulization BID  . mouth rinse  15 mL Mouth Rinse q12n4p  . thiamine injection  100 mg Intravenous Daily   Continuous Infusions: . sodium chloride Stopped (03/17/18 1527)  . sodium chloride 75 mL/hr at 03/19/18 0123  . sodium chloride Stopped (03/17/18 1736)  . cefTRIAXone (ROCEPHIN)  IV 2 g (03/19/18 0626)  . famotidine (PEPCID) IV 20 mg (03/18/18 2120)  . metronidazole 500 mg (03/18/18 2333)     LOS: 2 days    Time spent: 40 minutes    Yue Glasheen, Roselind Messier, MD [facility] Pager [phone]   If 7PM-7AM, please contact night-coverage [email or web address] Password [facility] 03/19/2018, 8:33 AM

## 2018-03-19 NOTE — Progress Notes (Signed)
Patient does not wish to utilize CPAP/BIPAP at this time, no distress noted RCP will continue to follow.

## 2018-03-19 NOTE — Clinical Social Work Note (Signed)
Clinical Social Work Assessment  Patient Details  Name: Jason Sosa MRN: [id number] Date of Birth: 12-20-1990  Date of referral:  03/19/18               Reason for consult:  Substance Use/ETOH Abuse                Permission sought to share information with:    Permission granted to share information::     Name::        Agency::     Relationship::     Contact Information:     Housing/Transportation Living arrangements for the past 2 months:  Apartment Source of Information:  Patient Patient Interpreter Needed:  None Criminal Activity/Legal Involvement Pertinent to Current Situation/Hospitalization:  No - Comment as needed Significant Relationships:  Parents, Friend Lives with:  Roommate Do you feel safe going back to the place where you live?  Yes Need for family participation in patient care:  No (Coment)  Care giving concerns: Patient admitted after being found unresponsive at home. ETOH 224. Positive for opiates and cocaine. CSW consulted for substance use assessment and resources.   Social Worker assessment / plan: This CSW and CSW Thurmond Butts met with patient at bedside. Patient alert and oriented. CSW introduced self and role. CSW completed SBIRT with patient. Patient endorsed alcohol use several times per week, and occasional blackouts, but was surprised and afraid to have woken up in the hospital. Patient states he does not recall using illicit drugs, but says, "I wouldn't put it past myself." Patient spoke at length about his history of substance use. Patient exhibited motivation to reduce alcohol use and talked about other activities and hobbies he could engage in in his free time.   Patient interested in outpatient substance use treatment. He has been to inpatient rehab and AA in the past. He states he cannot go to inpatient treatment now due to the cost and because he needs to go back to work. Patient would like to see a counselor outpatient. CSW provided list of  inpatient and outpatient substance use resources, and also referred patient to [facility] the [place]. Patient appreciative of resources. CSW signing off, as no additional needs identified at this time.   Employment status:  [name] information:  Self Pay (Medicaid Pending) PT Recommendations:  Not assessed at this time Information / Referral to community resources:  SBIRT, Outpatient Substance Abuse Treatment Options, Residential Substance Abuse Treatment Options, [facility] [place]  Patient/Family's Response to care: Patient appreciative of care.  Patient/Family's Understanding of and Emotional Response to Diagnosis, Current Treatment, and Prognosis: Patient with understanding of his condition and with understanding of the effect of alcohol and drug use on his health. Patient expressed fear about having woken up in the hospital and exhibited motivation to change alcohol use to avoid recurrent blackouts and hospitalizations.   Emotional Assessment Appearance:  Appears stated age Attitude/Demeanor/Rapport:  Engaged Affect (typically observed):  Accepting, Calm, Pleasant, Appropriate Orientation:  Oriented to Self, Oriented to Place, Oriented to  Time, Oriented to Situation Alcohol / Substance use:  Alcohol Use, Illicit Drugs Psych involvement (Current and /or in the community):  No (Comment)  Discharge Needs  Concerns to be addressed:  Substance Abuse Concerns Readmission within the last 30 days:  No Current discharge risk:  Substance Abuse Barriers to Discharge:  No Barriers Identified   Estanislado Emms, LCSW 03/19/2018, 12:00 PM

## 2018-03-20 DIAGNOSIS — J690 Pneumonitis due to inhalation of food and vomit: Secondary | ICD-10-CM

## 2018-03-20 DIAGNOSIS — J9601 Acute respiratory failure with hypoxia: Secondary | ICD-10-CM

## 2018-03-20 MED ORDER — IPRATROPIUM-ALBUTEROL 0.5-2.5 (3) MG/3ML IN SOLN: 3.0000 mL | Freq: Four times a day (QID) | RESPIRATORY_TRACT | Status: DC | PRN

## 2018-03-20 MED ORDER — FOLIC ACID 1 MG PO TABS: 1.0000 mg | ORAL_TABLET | Freq: Every day | ORAL | 0 refills | Status: DC

## 2018-03-20 MED ORDER — VITAMIN B-1 100 MG PO TABS: 100.0000 mg | ORAL_TABLET | Freq: Every day | ORAL | Status: DC

## 2018-03-20 MED ORDER — CEFPODOXIME PROXETIL 200 MG PO TABS: 200.0000 mg | ORAL_TABLET | Freq: Two times a day (BID) | ORAL | 0 refills | Status: DC

## 2018-03-20 MED ORDER — THIAMINE HCL 100 MG PO TABS: 100.0000 mg | ORAL_TABLET | Freq: Every day | ORAL | 0 refills | Status: DC

## 2018-03-20 MED ORDER — METRONIDAZOLE 500 MG PO TABS: 500.0000 mg | ORAL_TABLET | Freq: Three times a day (TID) | ORAL | 0 refills | Status: DC

## 2018-03-20 NOTE — Discharge Instructions (Signed)
Follow with Primary MD in 7 days   Get CBC, CMP, checked  by Primary MD next visit.    Activity: As tolerated with Full fall precautions use walker/cane & assistance as needed   Disposition Home    Diet: Regular  For Heart failure patients - Check your Weight same time everyday, if you gain over 2 pounds, or you develop in leg swelling, experience more shortness of breath or chest pain, call your Primary MD immediately. Follow Cardiac Low Salt Diet and 1.5 lit/day fluid restriction.   On your next visit with your primary care physician please Get Medicines reviewed and adjusted.   Please request your Prim.MD to go over all Hospital Tests and Procedure/Radiological results at the follow up, please get all Hospital records sent to your Prim MD by signing hospital release before you go home.   If you experience worsening of your admission symptoms, develop shortness of breath, life threatening emergency, suicidal or homicidal thoughts you must seek medical attention immediately by calling 911 or calling your MD immediately  if symptoms less severe.  You Must read complete instructions/literature along with all the possible adverse reactions/side effects for all the Medicines you take and that have been prescribed to you. Take any new Medicines after you have completely understood and accpet all the possible adverse reactions/side effects.   Do not drive, operating heavy machinery, perform activities at heights, swimming or participation in water activities or provide baby sitting services if your were admitted for syncope or siezures until you have seen by Primary MD or a Neurologist and advised to do so again.  Do not drive when taking Pain medications.    Do not take more than prescribed Pain, Sleep and Anxiety Medications  Special Instructions: If you have smoked or chewed Tobacco  in the last 2 yrs please stop smoking, stop any regular Alcohol  and or any Recreational drug  use.  Wear Seat belts while driving.   Please note  You were cared for by a hospitalist during your hospital stay. If you have any questions about your discharge medications or the care you received while you were in the hospital after you are discharged, you can call the unit and asked to speak with the hospitalist on call if the hospitalist that took care of you is not available. Once you are discharged, your primary care physician will handle any further medical issues. Please note that NO REFILLS for any discharge medications will be authorized once you are discharged, as it is imperative that you return to your primary care physician (or establish a relationship with a primary care physician if you do not have one) for your aftercare needs so that they can reassess your need for medications and monitor your lab values.

## 2018-03-20 NOTE — Discharge Summary (Signed)
Jason Sosa, is a 27 y.o. male  DOB [birth year]  MRN [id number].  Admission date:  03/17/2018  Admitting Physician  Oretha Milch, MD  Discharge Date:  03/20/2018   Primary MD  Patient, No Pcp Per  Recommendations for primary care physician for things to follow:  -Check CBC, BMP during next visit, continue counseling about alcohol/polysubstance abuse   Admission Diagnosis  Acute respiratory failure with hypoxia (HCC) [J96.01] AKI (acute kidney injury) (HCC) [N17.9] Acute alcoholic intoxication with complication (HCC) [F10.929] Aspiration pneumonia of right lung, unspecified aspiration pneumonia type, unspecified part of lung (HCC) [J69.0]   Discharge Diagnosis  Acute respiratory failure with hypoxia (HCC) [J96.01] AKI (acute kidney injury) (HCC) [N17.9] Acute alcoholic intoxication with complication (HCC) [F10.929] Aspiration pneumonia of right lung, unspecified aspiration pneumonia type, unspecified part of lung (HCC) [J69.0]    Active Problems:   Accidental ETOH poisoning (HCC)   Alcohol intoxication (HCC)      History reviewed. No pertinent past medical history.  History reviewed. No pertinent surgical history.     History of present illness and  Hospital Course:     Kindly see H&P for history of present illness and admission details, please review complete Labs, Consult reports and Test reports for all details in brief  HPI  from the history and physical done on the day of admission 03/17/2018  HISTORY OF PRESENT ILLNESS:   27 year old male with no significant past medical history found unresponsive in the shower after night of drinking.  There is also concern for medication overdose, however, he had minimal response to Narcan despite having oxycodone and his medication list.  Upon EMS arrival to the scene oxygen saturations of 70% on room air were noted.  He was bagged in route to the ED  with sats improved to 98%.  Upon arrival to the emergency department he was minimally more responsive and able to state that he had been drinking alcohol but unable to give any specifics.  He denies use of any other illicit substances.  Reportedly he "takes morphine sometimes ".  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  Treatment in the emergency department included supplemental oxygen via nonrebreather, and then eventually BiPAP   After he was found to have a respiratory acidosis.  This has been improved with BiPAP.  He is also treated with ceftriaxone and Flagyl for aspiration pneumonia which is demonstrated on chest x-ray. PCCM has been asked to admit.      Hospital Course    Acute metabolic encephalopathy - Secondary to EtOH abuse, substance abuse, hypoxia - Resolved  Acute respiratory failure with hypoxia and hypercapnia  -Secondary to pneumonia. - Resolved, currently on room air   Aspiration PNA -Treated with IV Rocephin and Flagyl during hospital stay given penicillin allergy, improved, repeat chest x-ray showing resolution of opacity, received total of 4 days of IV antibiotic during hospital stay, he will be discharged another 2 days on oral  Vantin and Flagyl.  EtOH abuse/polysubstance abuse -Upon admission EtOH= 224 -UDS positive opiates, cocaine -CIWA protocol during hospital stay, no requirements of Ativan over last 24 hours, no evidence of withdrawals, he will be discharged on thiamine and folic acid  Chest pain -Troponins not obtained at admission -EKG NSR -Echocardiogram WNL see results below  Acute kidney injury LastLabs       Recent Labs  Lab 03/17/18 0528 03/18/18 0321 03/19/18 0150  CREATININE 1.30* 0.88 0.86    -Resolved   Lactic acid elevated -Resolved   Hypophosphatemia - Repleted      Discharge Condition:  Stable   Follow UP  Follow-up Information     [facility] Follow up on 04/16/2018.   Why:  8:50 for hospital follow up Contact information: [place] [place] [phone]       [facility] Follow up.   Why:  use the pharmacy here for med assistance, closed on 7/4 Contact information: [address] [facility] [place] [phone]       Services, [facility] .   Contact information: [place] [place] [address] [phone]        [facility] [place] Call.   Specialty:  Professional Counselor Why:  Call to scheduled appointment. Contact information: [facility] [address] [place] [address] [phone]             Discharge Instructions  and  Discharge Medications     Discharge Instructions    Discharge instructions   Complete by:  As directed    Follow with Primary MD in 7 days   Get CBC, CMP, checked  by Primary MD next visit.    Activity: As tolerated with Full fall precautions use walker/cane & assistance as needed   Disposition Home    Diet: Regular  For Heart failure patients - Check your Weight same time everyday, if you gain over 2 pounds, or you develop in leg swelling, experience more shortness of breath or chest pain, call your Primary MD immediately. Follow Cardiac Low Salt Diet and 1.5 lit/day fluid restriction.   On your next visit with your primary care physician please Get Medicines reviewed and adjusted.   Please request your Prim.MD to go over all Hospital Tests and Procedure/Radiological results at the follow up, please get all Hospital records sent to your Prim MD by signing hospital release before you go home.   If you experience worsening of your admission symptoms, develop shortness of breath, life threatening emergency, suicidal or homicidal thoughts you must seek medical  attention immediately by calling 911 or calling your MD immediately  if symptoms less severe.  You Must read complete instructions/literature along with all the possible adverse reactions/side effects for all the Medicines you take and that have been prescribed to you. Take any new Medicines after you have completely understood and accpet all the possible adverse reactions/side effects.   Do not drive, operating heavy machinery, perform activities at heights, swimming or participation in water activities or provide baby sitting services if your were admitted for syncope or siezures until you have seen by Primary MD or a Neurologist and advised to do so again.  Do not drive when taking Pain medications.    Do not take more than prescribed Pain, Sleep and Anxiety Medications  Special Instructions: If you have smoked or chewed Tobacco  in the last 2 yrs please stop smoking, stop any regular Alcohol  and or any Recreational drug use.  Wear Seat belts while driving.   Please note  You were cared for by a hospitalist during your hospital stay. If you have any questions about your discharge medications or the care you received while you were in the hospital after you are discharged, you can call the unit and asked to speak with the hospitalist on call if the hospitalist that took care of you is not available. Once you are discharged, your primary care physician will handle any further medical issues. Please note that NO REFILLS for any discharge medications will be authorized once you are discharged, as it is imperative that you return to your primary care physician (or establish a relationship with a primary care physician if you do not have one) for your aftercare needs so that they can reassess your need for medications and monitor your lab values.   Discharge instructions   Complete by:  As directed    Follow with Primary MD in 7 days   Get CBC, CMP, checked  by Primary MD next visit.     Activity: As tolerated with Full fall precautions use walker/cane & assistance as needed   Disposition Home    Diet: Regular  For Heart failure patients - Check your Weight same time everyday, if you gain over 2 pounds, or you develop in leg swelling, experience more shortness of breath or chest pain, call your Primary MD immediately. Follow Cardiac Low Salt Diet and 1.5 lit/day fluid restriction.   On your next visit with your primary care physician please Get Medicines reviewed and adjusted.   Please request your Prim.MD to go over all Hospital Tests and Procedure/Radiological results at the follow up, please get all Hospital records sent to your Prim MD by signing hospital release before you go home.   If you experience worsening of your admission symptoms, develop shortness of breath, life threatening emergency, suicidal or homicidal thoughts you must seek medical attention immediately by calling 911 or calling your MD immediately  if symptoms less severe.  You Must read complete instructions/literature along with all the possible adverse reactions/side effects for all the Medicines you take and that have been prescribed to you. Take any new Medicines after you have completely understood and accpet all the possible adverse reactions/side effects.   Do not drive, operating heavy machinery, perform activities at heights, swimming or participation in water activities or provide baby sitting services if your were admitted for syncope or siezures until you have seen by Primary MD or a Neurologist and advised to do so again.  Do not drive when taking Pain medications.    Do not take more than prescribed Pain, Sleep and Anxiety Medications  Special Instructions: If you have smoked or chewed Tobacco  in the last 2 yrs please stop smoking, stop any regular Alcohol  and or any Recreational drug use.  Wear Seat belts while driving.   Please note  You were cared for by a hospitalist  during your hospital stay. If you have any questions about your discharge medications or the care you received while you were in the hospital after you are discharged, you can call the unit and asked to speak with the hospitalist on call if the hospitalist that took care of you is not available. Once you are discharged, your primary care physician will handle any further medical issues. Please note that NO REFILLS for  any discharge medications will be authorized once you are discharged, as it is imperative that you return to your primary care physician (or establish a relationship with a primary care physician if you do not have one) for your aftercare needs so that they can reassess your need for medications and monitor your lab values.   Increase activity slowly   Complete by:  As directed    Increase activity slowly   Complete by:  As directed      Allergies as of 03/20/2018      Reactions   Penicillins Hives   Has patient had a PCN reaction causing immediate rash, facial/tongue/throat swelling, SOB or lightheadedness with hypotension: Yes Has patient had a PCN reaction causing severe rash involving mucus membranes or skin necrosis: No Has patient had a PCN reaction that required hospitalization: No Has patient had a PCN reaction occurring within the last 10 years: No If all of the above answers are "NO", then may proceed with Cephalosporin use.      Medication List    TAKE these medications   cefpodoxime 200 MG tablet Commonly known as:  VANTIN Take 1 tablet (200 mg total) by mouth 2 (two) times daily.   folic acid 1 MG tablet Commonly known as:  FOLVITE Take 1 tablet (1 mg total) by mouth daily. Start taking on:  03/21/2018   metroNIDAZOLE 500 MG tablet Commonly known as:  FLAGYL Take 1 tablet (500 mg total) by mouth 3 (three) times daily.   thiamine 100 MG tablet Take 1 tablet (100 mg total) by mouth daily. Start taking on:  03/21/2018         Diet and Activity  recommendation: See Discharge Instructions above   Consults obtained -  PCCM   Major procedures and Radiology Reports - PLEASE review detailed and final reports for all details, in brief -      Dg Chest Port 1 View  Result Date: 03/18/2018 CLINICAL DATA:  Aspiration, shortness of breath. EXAM: PORTABLE CHEST 1 VIEW COMPARISON:  Radiograph of March 17, 2018. FINDINGS: Cardiac size is within normal limits. Central pulmonary vascular congestion is noted. Right upper lobe airspace opacity has resolved. No pneumothorax or pleural effusion is noted. The visualized skeletal structures are unremarkable. IMPRESSION: Right upper lobe airspace opacity noted on prior exam has resolved. Central pulmonary vascular congestion is noted. Electronically Signed   By: Lupita Raider, M.D.   On: 03/18/2018 07:47   Dg Chest Portable 1 View  Result Date: 03/17/2018 CLINICAL DATA:  Intoxication and unresponsiveness. Shortness of breath. EXAM: PORTABLE CHEST 1 VIEW COMPARISON:  None. FINDINGS: Normal heart size. There is diffuse moderate pulmonary edema. There is upper right lung consolidation which may indicate aspiration. No pneumothorax or sizable pleural effusion. IMPRESSION: Diffuse moderate pulmonary edema with right upper lobe consolidation, likely aspiration. Electronically Signed   By: Deatra Robinson M.D.   On: 03/17/2018 05:36    Micro Results    Recent Results (from the past 240 hour(s))  MRSA PCR Screening     Status: None   Collection Time: 03/17/18 11:46 AM  Result Value Ref Range Status   MRSA by PCR NEGATIVE NEGATIVE Final    Comment:        The GeneXpert MRSA Assay (FDA approved for NASAL specimens only), is one component of a comprehensive MRSA colonization surveillance program. It is not intended to diagnose MRSA infection nor to guide or monitor treatment for MRSA infections. Performed at [facility], [address], [place], [place] [address]        Today   Subjective:     Jason Sosa today has no headache,no chest or abdominal pain,no new weakness tingling or numbness, feels much better wants to go home today.  Rated breakfast with no nausea or vomiting  Objective:   Blood pressure 122/84, pulse 74, temperature 98.6 F (37 C), temperature source Oral, resp. rate 18, height 6' (1.829 m), weight 101.1 kg (222 lb 14.2 oz), SpO2 96 %.   Intake/Output Summary (Last 24 hours) at 03/20/2018 1113 Last data filed at 03/20/2018 0536 Gross per 24 hour  Intake 1572.76 ml  Output 500 ml  Net 1072.76 ml    Exam Awake Alert, Oriented x 3, No new F.N deficits, Normal affect Symmetrical Chest wall movement, Good air movement bilaterally, CTAB RRR,No Gallops,Rubs or new Murmurs, No Parasternal Heave +ve B.Sounds, Abd Soft, Non tender,  No rebound -guarding or rigidity. No Cyanosis, Clubbing or edema, No new Rash or bruise  Data Review   CBC w Diff:  Lab Results  Component Value Date   WBC 10.6 (H) 03/18/2018   HGB 13.1 03/18/2018   HCT 39.8 03/18/2018   PLT 209 03/18/2018   LYMPHOPCT 40 03/17/2018   MONOPCT 13 03/17/2018   EOSPCT 3 03/17/2018   BASOPCT 1 03/17/2018    CMP:  Lab Results  Component Value Date   NA 136 03/19/2018   K 3.5 03/19/2018   CL 104 03/19/2018   CO2 24 03/19/2018   BUN <5 (L) 03/19/2018   CREATININE 0.86 03/19/2018   PROT 6.3 (L) 03/19/2018   ALBUMIN 3.4 (L) 03/19/2018   BILITOT 0.9 03/19/2018   ALKPHOS 80 03/19/2018   AST 27 03/19/2018   ALT 28 03/19/2018  .   Total Time in preparing paper work, data evaluation and todays exam - 35 minutes  Huey Bienenstock M.D on 03/20/2018 at 11:13 AM  [facility]   Office  [phone]

## 2018-04-16 ENCOUNTER — Ambulatory Visit (INDEPENDENT_AMBULATORY_CARE_PROVIDER_SITE_OTHER): Payer: Self-pay | Admitting: Physician Assistant

## 2018-05-14 ENCOUNTER — Ambulatory Visit (INDEPENDENT_AMBULATORY_CARE_PROVIDER_SITE_OTHER): Payer: Self-pay | Admitting: Physician Assistant

## 2019-08-05 IMAGING — DX DG CHEST 1V PORT
1 series · 1 of 1 positions shown · non-contrast
Comparison: Radiograph March 17, 2018.

CLINICAL DATA: Aspiration, shortness of breath.

EXAM:
PORTABLE CHEST 1 VIEW

[chest]
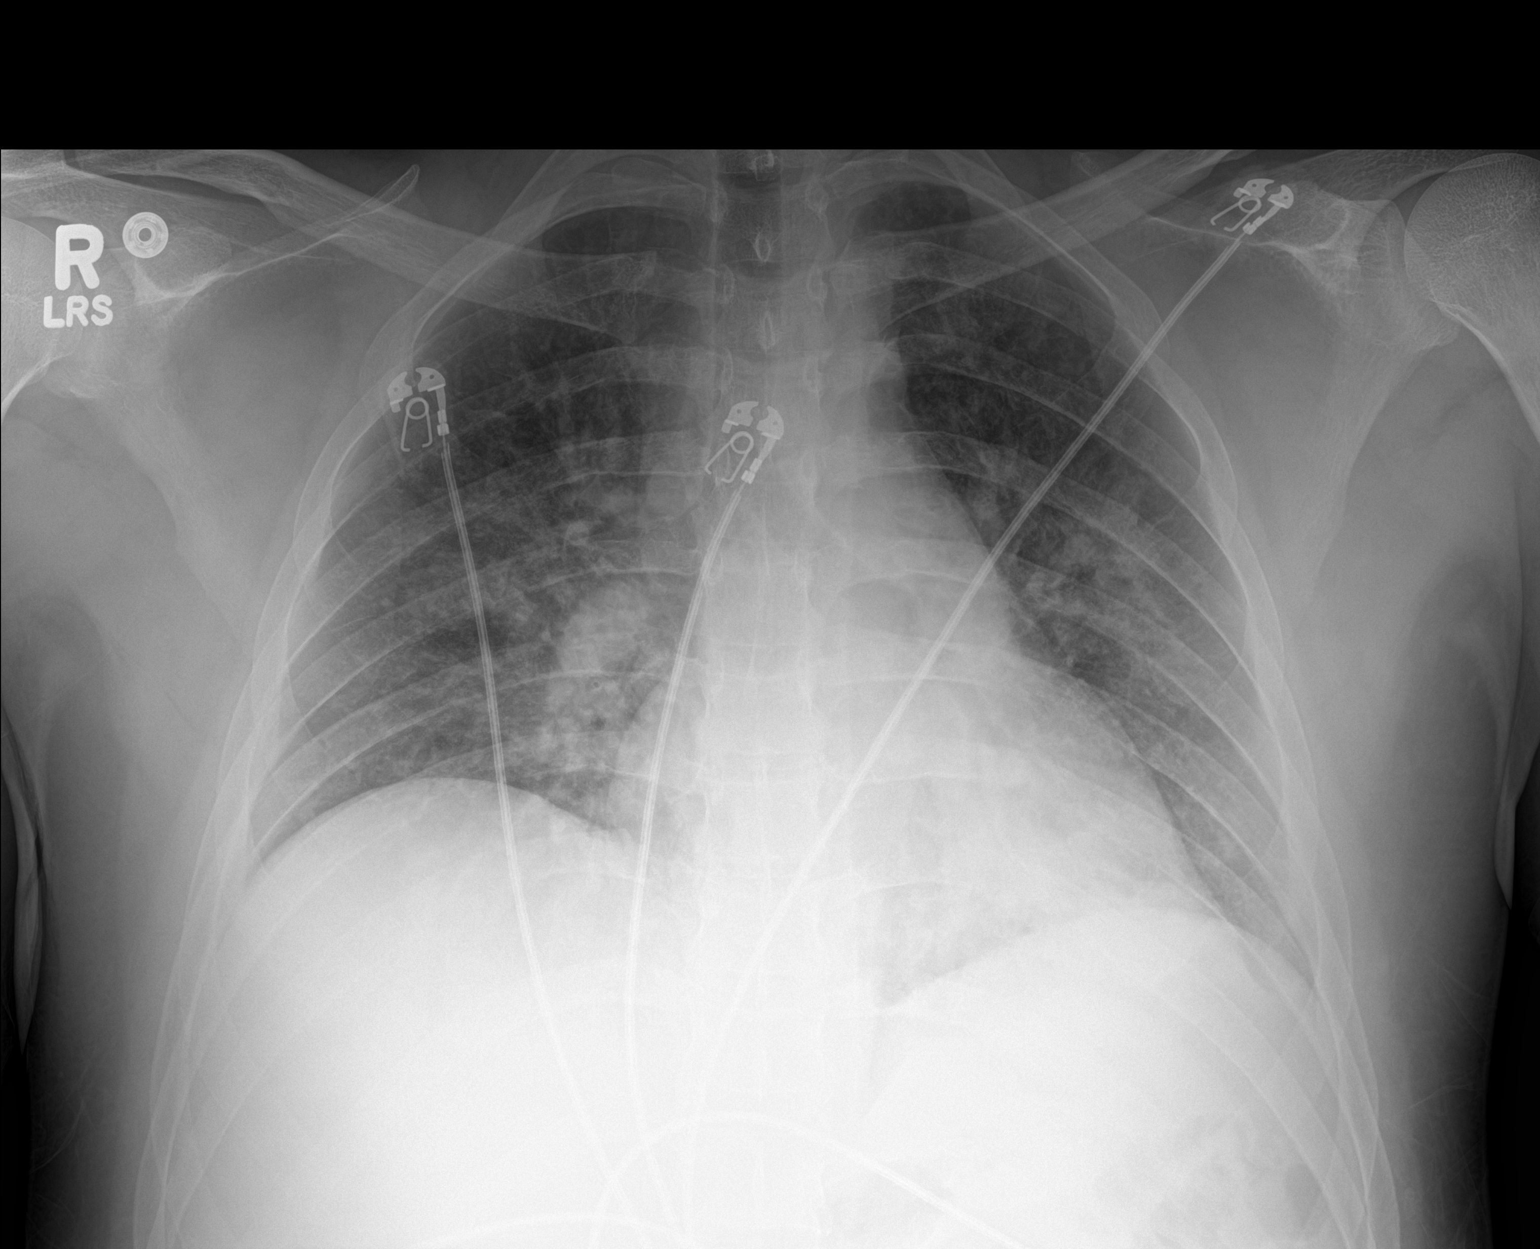

[1 of 1 positions shown; findings below may reference images not displayed]

FINDINGS: Cardiac size is within normal limits. Central pulmonary vascular
congestion is noted. Right upper lobe airspace opacity has resolved.
No pneumothorax or pleural effusion is noted.. The visualized
skeletal structures are unremarkable.
IMPRESSION: Right upper lobe airspace opacity noted on prior exam has resolved.
Central pulmonary vascular congestion is noted.

## 2021-03-24 ENCOUNTER — Inpatient Hospital Stay (HOSPITAL_COMMUNITY)
Admission: EM | Admit: 2021-03-24 | Discharge: 2021-03-27 | DRG: 603 | Disposition: A | Discharge status: Home / Self Care | Payer: Self-pay | Attending: Family Medicine | Admitting: Family Medicine

## 2021-03-24 ENCOUNTER — Other Ambulatory Visit: Payer: Self-pay

## 2021-03-24 ENCOUNTER — Encounter (HOSPITAL_COMMUNITY): Payer: Self-pay | Admitting: *Deleted

## 2021-03-24 ENCOUNTER — Emergency Department (HOSPITAL_COMMUNITY): Payer: Self-pay

## 2021-03-24 DIAGNOSIS — R519 Headache, unspecified: Secondary | ICD-10-CM | POA: Diagnosis present

## 2021-03-24 DIAGNOSIS — D72829 Elevated white blood cell count, unspecified: Secondary | ICD-10-CM | POA: Diagnosis present

## 2021-03-24 DIAGNOSIS — R1310 Dysphagia, unspecified: Secondary | ICD-10-CM | POA: Diagnosis present

## 2021-03-24 DIAGNOSIS — R030 Elevated blood-pressure reading, without diagnosis of hypertension: Secondary | ICD-10-CM | POA: Diagnosis present

## 2021-03-24 DIAGNOSIS — Z20822 Contact with and (suspected) exposure to covid-19: Secondary | ICD-10-CM | POA: Diagnosis present

## 2021-03-24 DIAGNOSIS — H532 Diplopia: Secondary | ICD-10-CM | POA: Diagnosis present

## 2021-03-24 DIAGNOSIS — K029 Dental caries, unspecified: Secondary | ICD-10-CM | POA: Diagnosis present

## 2021-03-24 DIAGNOSIS — L03211 Cellulitis of face: Principal | ICD-10-CM | POA: Diagnosis present

## 2021-03-24 DIAGNOSIS — Z880 Allergy status to penicillin: Secondary | ICD-10-CM

## 2021-03-24 LAB — CBC WITH DIFFERENTIAL/PLATELET
Abs Immature Granulocytes: 0.07 10*3/uL (ref 0.00–0.07)
Basophils Absolute: 0.1 10*3/uL (ref 0.0–0.1)
Basophils Relative: 0 %
Eosinophils Absolute: 0.1 10*3/uL (ref 0.0–0.5)
Eosinophils Relative: 0 %
HCT: 41.2 % (ref 39.0–52.0)
Hemoglobin: 14.2 g/dL (ref 13.0–17.0)
Immature Granulocytes: 1 %
Lymphocytes Relative: 7 %
Lymphs Abs: 1.1 10*3/uL (ref 0.7–4.0)
MCH: 31.1 pg (ref 26.0–34.0)
MCHC: 34.5 g/dL (ref 30.0–36.0)
MCV: 90.4 fL (ref 80.0–100.0)
Monocytes Absolute: 2.2 10*3/uL — ABNORMAL HIGH (ref 0.1–1.0)
Monocytes Relative: 14 %
Neutro Abs: 11.8 10*3/uL — ABNORMAL HIGH (ref 1.7–7.7)
Neutrophils Relative %: 78 %
Platelets: 260 10*3/uL (ref 150–400)
RBC: 4.56 MIL/uL (ref 4.22–5.81)
RDW: 12.3 % (ref 11.5–15.5)
WBC: 15.3 10*3/uL — ABNORMAL HIGH (ref 4.0–10.5)
nRBC: 0 % (ref 0.0–0.2)

## 2021-03-24 LAB — BASIC METABOLIC PANEL
Anion gap: 9 (ref 5–15)
BUN: 8 mg/dL (ref 6–20)
CO2: 26 mmol/L (ref 22–32)
Calcium: 9.3 mg/dL (ref 8.9–10.3)
Chloride: 103 mmol/L (ref 98–111)
Creatinine, Ser: 0.93 mg/dL (ref 0.61–1.24)
GFR, Estimated: >60 mL/min (ref >60)
Glucose, Bld: 121 mg/dL — ABNORMAL HIGH (ref 70–99)
Potassium: 3.7 mmol/L (ref 3.5–5.1)
Sodium: 138 mmol/L (ref 135–145)

## 2021-03-24 LAB — HIV ANTIBODY (ROUTINE TESTING W REFLEX): HIV Screen 4th Generation wRfx: NONREACTIVE

## 2021-03-24 LAB — LACTIC ACID, PLASMA
Lactic Acid, Venous: 1.3 mmol/L (ref 0.5–1.9)
Lactic Acid, Venous: 1 mmol/L (ref 0.5–1.9)

## 2021-03-24 LAB — HEMOGLOBIN A1C
Hgb A1c MFr Bld: 5.1 % (ref 4.8–5.6)
Mean Plasma Glucose: 99.67 mg/dL

## 2021-03-24 MED ORDER — LACTATED RINGERS IV BOLUS
1000.0000 mL | Freq: Once | INTRAVENOUS | Status: AC
  Administered 2021-03-24: 1000 mL via INTRAVENOUS

## 2021-03-24 MED ORDER — KETOROLAC TROMETHAMINE 30 MG/ML IJ SOLN
30.0000 mg | Freq: Once | INTRAMUSCULAR | Status: AC
  Administered 2021-03-24: 30 mg via INTRAVENOUS
  Filled 2021-03-24: qty 1

## 2021-03-24 MED ORDER — SODIUM CHLORIDE 0.9 % IV SOLN
INTRAVENOUS | Status: DC | PRN
  Administered 2021-03-24: 500 mL via INTRAVENOUS

## 2021-03-24 MED ORDER — ENOXAPARIN SODIUM 40 MG/0.4ML IJ SOSY
40.0000 mg | PREFILLED_SYRINGE | INTRAMUSCULAR | Status: DC
  Administered 2021-03-24 – 2021-03-26 (×3): 40 mg via SUBCUTANEOUS
  Filled 2021-03-24 (×3): qty 0.4

## 2021-03-24 MED ORDER — SODIUM CHLORIDE (PF) 0.9 % IJ SOLN
INTRAMUSCULAR | Status: AC
  Filled 2021-03-24: qty 50

## 2021-03-24 MED ORDER — MORPHINE SULFATE (PF) 2 MG/ML IV SOLN
1.0000 mg | INTRAVENOUS | Status: DC | PRN
  Administered 2021-03-24 – 2021-03-25 (×2): 1 mg via INTRAVENOUS
  Filled 2021-03-24 (×2): qty 1

## 2021-03-24 MED ORDER — IOHEXOL 300 MG/ML  SOLN
60.0000 mL | Freq: Once | INTRAMUSCULAR | Status: AC | PRN
  Administered 2021-03-24: 60 mL via INTRAVENOUS

## 2021-03-24 MED ORDER — SODIUM CHLORIDE 0.9% FLUSH: 3.0000 mL | INTRAVENOUS | Status: DC | PRN

## 2021-03-24 MED ORDER — SODIUM CHLORIDE 0.9 % IV SOLN: 250.0000 mL | INTRAVENOUS | Status: DC | PRN

## 2021-03-24 MED ORDER — HYDROMORPHONE HCL 1 MG/ML IJ SOLN: 1.0000 mg | INTRAMUSCULAR | Status: DC | PRN

## 2021-03-24 MED ORDER — MORPHINE SULFATE (PF) 4 MG/ML IV SOLN
4.0000 mg | Freq: Once | INTRAVENOUS | Status: AC
  Administered 2021-03-24: 4 mg via INTRAVENOUS
  Filled 2021-03-24: qty 1

## 2021-03-24 MED ORDER — SODIUM CHLORIDE 0.9% FLUSH
3.0000 mL | Freq: Two times a day (BID) | INTRAVENOUS | Status: DC
  Administered 2021-03-24 – 2021-03-25 (×2): 3 mL via INTRAVENOUS

## 2021-03-24 MED ORDER — VANCOMYCIN HCL 1250 MG/250ML IV SOLN
1250.0000 mg | Freq: Two times a day (BID) | INTRAVENOUS | Status: DC
  Administered 2021-03-25 – 2021-03-27 (×5): 1250 mg via INTRAVENOUS
  Filled 2021-03-24 (×6): qty 250

## 2021-03-24 MED ORDER — ONDANSETRON HCL 4 MG/2ML IJ SOLN: 4.0000 mg | Freq: Four times a day (QID) | INTRAMUSCULAR | Status: DC | PRN

## 2021-03-24 MED ORDER — ONDANSETRON HCL 4 MG PO TABS: 4.0000 mg | ORAL_TABLET | Freq: Four times a day (QID) | ORAL | Status: DC | PRN

## 2021-03-24 MED ORDER — VANCOMYCIN HCL 2000 MG/400ML IV SOLN
2000.0000 mg | Freq: Once | INTRAVENOUS | Status: AC
  Administered 2021-03-24: 2000 mg via INTRAVENOUS
  Filled 2021-03-24: qty 400

## 2021-03-24 MED ORDER — ACETAMINOPHEN 650 MG RE SUPP: 650.0000 mg | Freq: Four times a day (QID) | RECTAL | Status: DC | PRN

## 2021-03-24 MED ORDER — LACTATED RINGERS IV SOLN: INTRAVENOUS | Status: DC

## 2021-03-24 MED ORDER — HYDROCODONE-ACETAMINOPHEN 5-325 MG PO TABS
1.0000 | ORAL_TABLET | ORAL | Status: DC | PRN
  Administered 2021-03-24 – 2021-03-26 (×9): 2 via ORAL
  Filled 2021-03-24 (×9): qty 2

## 2021-03-24 MED ORDER — CLINDAMYCIN PHOSPHATE 600 MG/50ML IV SOLN
600.0000 mg | Freq: Once | INTRAVENOUS | Status: AC
  Administered 2021-03-24: 600 mg via INTRAVENOUS
  Filled 2021-03-24: qty 50

## 2021-03-24 MED ORDER — ACETAMINOPHEN 325 MG PO TABS: 650.0000 mg | ORAL_TABLET | Freq: Four times a day (QID) | ORAL | Status: DC | PRN

## 2021-03-24 MED ORDER — SODIUM CHLORIDE 0.9 % IV SOLN
2.0000 g | INTRAVENOUS | Status: DC
  Administered 2021-03-24 – 2021-03-26 (×3): 2 g via INTRAVENOUS
  Filled 2021-03-24 (×4): qty 20

## 2021-03-24 NOTE — ED Provider Notes (Signed)
I provided a substantive portion of the care of this patient.  I personally performed the entirety of the medical decision making for this encounter.    Patient initially had a small lesion on his right upper lip that he thought was a small cut from shaving 2 days ago.  It started to get swollen and look infected.  He reports he got a little bit of drainage out of it but it got severely swollen and painful overnight.  He is lip and face are now very swollen.  He reports that he has light sensitivity and it hurts to turn his eyes side to side.  He does not have a thermometer to know if he had a fever.  Patient is alert.  He is in a lot of pain.  Nontoxic but ill.  Patient has large firm swelling of the upper lip with extreme tenderness.  Also moderate swelling and tenderness extending over the cheeks bilaterally.  Patient has photophobia and pain with eye motions but intact and symmetric extraocular motions.  No drainage or discharge from the eyes.  No proptosis.  No respiratory distress.  Patient presents with a severe facial cellulitis.  No drainable focus.  CT shows diffuse involvement over both sides of the face.  Patient clinically also has symptoms concerning for early orbital cellulitis.  He has photophobia and pain with eye motion but no proptosis.  Plan for admission and IV antibiotics and pain control.   Arby Barrette, MD 03/24/21 1248

## 2021-03-24 NOTE — H&P (Signed)
History and Physical    Jason Sosa XKG:[id number] DOB: [birth year] DOA: 03/24/2021  PCP: Pcp, No Consultants:  none Patient coming from:  Home - lives alone  Chief Complaint: facial swelling with pain   HPI: Jason Sosa is a 30 y.o. male with no significant medical history who presented to ER with facial pain and swelling. Symptoms started about 3 days ago. He thinks he had an ingrown hair under his nose where swelling first started to swell and then progressed to his upper lip and bilateral cheeks. He denies any problems swallowing or breathing, but pain can make this difficult at times. He states he has blurry vision at times and feels mild pressure under the eyes. He states he had his eyes checked recently and was told he has early cataracts and has glasses coming in the mail. No recorded fevers at home, but has had chills.  Has had some headaches, but none today. No N/V/D.    ED Course: vitals: afebrile, bp: 159/109, HR: 88, RR: 16, oxygen: 99% RA. Labs with WBC: 15.3 with shift. Lactic acid: wnl. CT face: facial cellulitis. See below for description. Asked to admit for facial cellulitis.   Review of Systems: As per HPI; otherwise review of systems reviewed and negative.   Ambulatory Status:  Ambulates without assistance   History reviewed. No pertinent past medical history.  No past surgical history on file.  Social History   Socioeconomic History   Marital status: Single    Spouse name: Not on file   Number of children: Not on file   Years of education: Not on file   Highest education level: Not on file  Occupational History   Not on file  Tobacco Use   Smoking status: Some Days    Pack years: 0.00   Smokeless tobacco: Never  Vaping Use   Vaping Use: Never used  Substance and Sexual Activity   Alcohol use: Yes   Drug use: No   Sexual activity: Yes  Other Topics Concern   Not on file  Social History Narrative   Not on file   Social Determinants of Health    Financial Resource Strain: Not on file  Food Insecurity: Not on file  Transportation Needs: Not on file  Physical Activity: Not on file  Stress: Not on file  Social Connections: Not on file  Intimate Partner Violence: Not on file    Allergies  Allergen Reactions   Penicillins Hives    Has patient had a PCN reaction causing immediate rash, facial/tongue/throat swelling, SOB or lightheadedness with hypotension: Yes Has patient had a PCN reaction causing severe rash involving mucus membranes or skin necrosis: No Has patient had a PCN reaction that required hospitalization: No Has patient had a PCN reaction occurring within the last 10 years: No If all of the above answers are "NO", then may proceed with Cephalosporin use.     No family history on file.  Prior to Admission medications   Medication Sig Start Date End Date Taking? Authorizing Provider  ibuprofen (ADVIL) 200 MG tablet Take 800 mg by mouth every 6 (six) hours as needed for headache, fever or mild pain.   Yes [provider]  folic acid (FOLVITE) 1 MG tablet Take 1 tablet (1 mg total) by mouth daily. Patient not taking: Reported on 03/24/2021 03/21/18   Elgergawy, Leana Roe, MD  thiamine 100 MG tablet Take 1 tablet (100 mg total) by mouth daily. Patient not taking: No sig reported 03/21/18  Elgergawy, Leana Roe, MD    Physical Exam: Vitals:   03/24/21 0854 03/24/21 1101 03/24/21 1245 03/24/21 1406  BP:   (!) 155/92   Pulse:   61   Resp:   16   Temp:    99.6 F (37.6 C)  TempSrc:    Rectal  SpO2:   98%   Weight:  104.3 kg    Height: 5\' 11"  (1.803 m) 5\' 11"  (1.803 m)       General:  Appears calm and comfortable and is in NAD Eyes:  PERRL, EOMI, normal lids, iris ENT:  grossly normal hearing, severely edematous upper lip mmm; poor dentition-no obvious abscess. Edema and erythema/shiny skin across bilateral maxillae extending inferior to bilateral orbits. TTP.  Neck:  no LAD, masses or thyromegaly; no  carotid bruits Cardiovascular:  RRR, no m/r/g. No LE edema.  Respiratory:   CTA bilaterally with no wheezes/rales/rhonchi.  Normal respiratory effort. Abdomen:  soft, NT, ND, NABS Back:   normal alignment, no CVAT Skin:  no rash or induration seen on limited exam Musculoskeletal:  grossly normal tone BUE/BLE, good ROM, no bony abnormality Lower extremity:  No LE edema.  Limited foot exam with no ulcerations.  2+ distal pulses. Psychiatric:  grossly normal mood and affect, speech fluent and appropriate, AOx3 Neurologic:  CN 2-12 grossly intact, moves all extremities in coordinated fashion, sensation intact    Radiological Exams on Admission: Independently reviewed - see discussion in A/P where applicable  CT Soft Tissue Neck W Contrast  Result Date: 03/24/2021 CLINICAL DATA:  Concern for neck abscess. Swelling of the right side of face with difficulty swelling for about 3 days. EXAM: CT NECK WITH CONTRAST TECHNIQUE: Multidetector CT imaging of the neck was performed using the standard protocol following the bolus administration of intravenous contrast. CONTRAST:  73mL OMNIPAQUE IOHEXOL 300 MG/ML  SOLN COMPARISON:  None. FINDINGS: Subcutaneous soft tissues: There is subcutaneous soft tissue stranding/edema and thickening involving the upper lip and overlying the right greater than left mandibles. No discrete drainable fluid collection. Dental: Caries and periapical lucencies of multiple mandibular teeth, including the second premolar and third molar on the right and the first and third molars on the left. Pharynx and larynx: Symmetrically prominent adenoids and tonsils. No discrete mass. Salivary glands: No inflammation, mass, or stone. Thyroid: Normal. Lymph nodes: Prominent jugulodigastric nodes bilaterally. Vascular: Limited evaluation due to non arterial timing. Limited intracranial: Limited evaluation without obvious acute abnormality in the visualized brain. Visualized orbits: Negative. Mastoids  and visualized paranasal sinuses: Minimal inferior right maxillary sinus mucosal thickening, likely odontogenic. Otherwise, sinuses are clear. No mastoid effusions. Skeleton: No evidence of acute abnormality. Upper chest: Visualized lung apices are clear. IMPRESSION: 1. Subcutaneous edema of the upper lip and overlying bilateral mandibles, compatible cellulitis/phlegmon. No discrete, drainable fluid collection 2. Caries and periapical lucencies of multiple mandibular teeth, including the second premolar and third molar on the right and the first and third molars on the left. 3. Prominent adenoids and tonsils, most likely hypertrophy. Recommend correlation with direct inspection. 4. Prominent jugulodigastric nodes bilaterally, nonspecific but potentially reactive given the above findings. Electronically Signed   By: 05/25/2021 MD   On: 03/24/2021 12:22     Labs on Admission: I have personally reviewed the available labs and imaging studies at the time of the admission.  Pertinent labs:  WBC: 15.3   Assessment/Plan Principal Problem:   Facial cellulitis -appears to be secondary to possible ingrown hair under left nare. Also  has poor dentition, no obvious abscess   -blood cx pending -received 1 dose of clindamycin in ER, but changing to vacn/rocephin until orbital cellulitis ruled out with vision complaints.  -opthalmology consulted : Dr. Vonna Kotyk. Will come by and see.  -checking a1c  Active Problems:   Leukocytosis  -secondary to #1.   Tobacco abuse Declines patch   Body mass index is 32.08 kg/m.   Level of care: Med-Surg DVT prophylaxis:  Lovenox  Code Status:  Full - confirmed with patient Family Communication: None present Disposition Plan:  The patient is from: home  Anticipated d/c is to: home   Patient is currently: ill requiring inpatient hospitalization for IV antibiotics and pain control.  Consults called: opthamology- Dr. Vonna Kotyk  Admission status:  inpatient     Orland Mustard MD [facility]   How to contact the [facility] Attending or Consulting provider 7A - 7P or covering provider during after hours 7P -7A, for this patient?  Check the care team in [facility] and look for a) attending/consulting [facility] provider listed and b) the [facility] team listed Log into [email or web address] and use [facility]'s universal password to access. If you do not have the password, please contact the hospital operator. Locate the [facility] provider you are looking for under [facility] and page to a number that you can be directly reached. If you still have difficulty reaching the provider, please page the [facility] (Director on Call) for the Hospitalists listed on amion for assistance.   03/24/2021, 2:18 PM

## 2021-03-24 NOTE — Progress Notes (Signed)
Pharmacy Antibiotic Note  Jason Sosa is a 30 y.o. male admitted on 03/24/2021 with facial swelling, cellulitis.  Pharmacy has been consulted for Vancomcyin dosing.  Plan: Ceftriaxone 2g IV q24h per MD Vancomycin 2000 mg IV x1, then 1250 mg IV q12h. (SCr 0.93, est AUC 461) Measure Vanc peak and trough at steady state as needed.  Goal AUC = 400 - 500  Follow up renal function, culture results, and clinical course.   Height: 5\' 11"  (180.3 cm) Weight: 104.3 kg (230 lb) IBW/kg (Calculated) : 75.3  Temp (24hrs), Avg:99 F (37.2 C), Min:98.3 F (36.8 C), Max:99.6 F (37.6 C)  Recent Labs  Lab 03/24/21 1035 03/24/21 1324  WBC 15.3*  --   CREATININE 0.93  --   LATICACIDVEN  --  1.3    Estimated Creatinine Clearance: 142.8 mL/min (by C-G formula based on SCr of 0.93 mg/dL).    Allergies  Allergen Reactions   Penicillins Hives    Has patient had a PCN reaction causing immediate rash, facial/tongue/throat swelling, SOB or lightheadedness with hypotension: Yes Has patient had a PCN reaction causing severe rash involving mucus membranes or skin necrosis: No Has patient had a PCN reaction that required hospitalization: No Has patient had a PCN reaction occurring within the last 10 years: No If all of the above answers are "NO", then may proceed with Cephalosporin use.     Antimicrobials this admission: 7/9 Clindamycin x1 7/9 Ceftriaxone >> 7/9 Vancomycin >>  Dose adjustments this admission:   Microbiology results: 7/9 Bcx:   Thank you for allowing pharmacy to be a part of this patient's care.  9/9 PharmD, BCPS Clinical Pharmacist [facility] main pharmacy [phone] 03/24/2021 2:16 PM

## 2021-03-24 NOTE — ED Triage Notes (Signed)
Patient reports face began swelling two days ago after shaving. Pain 10/10. Patients mother with patient and says patient has broken teeth in front.

## 2021-03-24 NOTE — ED Provider Notes (Signed)
[facility]-EMERGENCY DEPT Provider Note   CSN: [id number] Arrival date & time: 03/24/21  0843     History Chief Complaint  Patient presents with   Facial Swelling    Jason Sosa is a 30 y.o. male with no reported past medical history.  Patient presents emerged part with a chief plaint of facial swelling.  Patient reports that his facial swelling began 3 days prior.  Swelling has gotten progressively worse since then.  He states that swelling started after an episode of shaving.  Patient endorses pain throughout his entire face.  Patient rates pain 10/10 on the pain scale.  Pain is worse with touch or movement.  Patient has had no alleviation of his symptoms with ice or ibuprofen.  Patient denies taking lisinopril medication.  Patient denies any history of food allergies.  Patient also endorses diplopia and photophobia.  This has developed within the last 24 hours.  Patient endorses dental pain.  States that he has history of dental caries and dental injuries throughout upper right teeth.  Patient has not been to a dentist due to cost.  Patient endorses trismus, chills, trouble swallowing, drooling.  Patient denies any fevers, neck stiffness.    HPI     No past medical history on file.  Patient Active Problem List   Diagnosis Date Noted   Accidental ETOH poisoning (HCC) 03/17/2018   Alcohol intoxication (HCC) 03/17/2018    No past surgical history on file.     No family history on file.  Social History   Tobacco Use   Smoking status: Some Days    Pack years: 0.00   Smokeless tobacco: Never  Vaping Use   Vaping Use: Never used  Substance Use Topics   Alcohol use: Yes   Drug use: No    Home Medications Prior to Admission medications   Medication Sig Start Date End Date Taking? Authorizing Provider  cefpodoxime (VANTIN) 200 MG tablet Take 1 tablet (200 mg total) by mouth 2 (two) times daily. 03/20/18   Elgergawy, Leana Roe, MD  folic acid (FOLVITE) 1  MG tablet Take 1 tablet (1 mg total) by mouth daily. 03/21/18   Elgergawy, Leana Roe, MD  metroNIDAZOLE (FLAGYL) 500 MG tablet Take 1 tablet (500 mg total) by mouth 3 (three) times daily. 03/20/18   Elgergawy, Leana Roe, MD  thiamine 100 MG tablet Take 1 tablet (100 mg total) by mouth daily. 03/21/18   Elgergawy, Leana Roe, MD    Allergies    Penicillins  Review of Systems   Review of Systems  Constitutional:  Positive for chills. Negative for fever.  HENT:  Positive for drooling, facial swelling, sore throat, trouble swallowing and voice change.   Eyes:  Positive for photophobia and visual disturbance (Diplopia). Negative for pain and discharge.  Respiratory:  Negative for shortness of breath.   Cardiovascular:  Negative for chest pain.  Gastrointestinal:  Negative for abdominal pain, nausea and vomiting.  Musculoskeletal:  Negative for back pain, neck pain and neck stiffness.  Skin:  Negative for color change and rash.  Allergic/Immunologic: Negative for immunocompromised state.  Neurological:  Negative for dizziness, syncope, light-headedness and headaches.  Psychiatric/Behavioral:  Negative for confusion.    Physical Exam Updated Vital Signs BP (!) 159/109 (BP Location: Right Arm)   Pulse 88   Temp 98.3 F (36.8 C) (Oral)   Resp 16   Ht 5\' 11"  (1.803 m)   SpO2 99%   BMI 31.09 kg/m   Physical Exam  Vitals and nursing note reviewed.  Constitutional:      General: He is not in acute distress.    Appearance: He is ill-appearing. He is not toxic-appearing or diaphoretic.  HENT:     Head: Normocephalic.     Mouth/Throat:     Dentition: Abnormal dentition. Dental tenderness and dental caries present.     Pharynx: Posterior oropharyngeal erythema present. No pharyngeal swelling, oropharyngeal exudate or uvula swelling.     Tonsils: No tonsillar exudate or tonsillar abscesses. 2+ on the right. 2+ on the left.     Comments: Swelling to upper lip, exquisitely tender to touch.  Swelling  extends into bilateral cheeks.  Patient able to handle oral secretions without difficulty.  Poor dentition throughout.  Dental tenderness to upper right molars and incisor.  Trismus noted.  Difficult to completely assess oropharynx, erythema noted Eyes:     General: No scleral icterus.       Right eye: No discharge.        Left eye: No discharge.     Extraocular Movements: Extraocular movements intact.     Comments: Pain with ocular movement, no proptosis noted.  Neck:     Comments: Pain with passive range of motion of neck Cardiovascular:     Rate and Rhythm: Normal rate.  Pulmonary:     Effort: Pulmonary effort is normal. No bradypnea or respiratory distress.     Breath sounds: Normal breath sounds. No stridor.  Musculoskeletal:     Cervical back: Full passive range of motion without pain, normal range of motion and neck supple. No edema, erythema, rigidity or crepitus. No pain with movement, spinous process tenderness or muscular tenderness. Normal range of motion.  Lymphadenopathy:     Cervical: Cervical adenopathy present.  Skin:    General: Skin is warm and dry.  Neurological:     General: No focal deficit present.     Mental Status: He is alert.  Psychiatric:        Behavior: Behavior is cooperative.         ED Results / Procedures / Treatments   Labs (all labs ordered are listed, but only abnormal results are displayed) Labs Reviewed  CBC WITH DIFFERENTIAL/PLATELET - Abnormal; Notable for the following components:      Result Value   WBC 15.3 (*)    Neutro Abs 11.8 (*)    Monocytes Absolute 2.2 (*)    All other components within normal limits  BASIC METABOLIC PANEL - Abnormal; Notable for the following components:   Glucose, Bld 121 (*)    All other components within normal limits  CULTURE, BLOOD (ROUTINE X 2)  CULTURE, BLOOD (ROUTINE X 2)  LACTIC ACID, PLASMA  LACTIC ACID, PLASMA    EKG None  Radiology CT Soft Tissue Neck W Contrast  Result Date:  03/24/2021 CLINICAL DATA:  Concern for neck abscess. Swelling of the right side of face with difficulty swelling for about 3 days. EXAM: CT NECK WITH CONTRAST TECHNIQUE: Multidetector CT imaging of the neck was performed using the standard protocol following the bolus administration of intravenous contrast. CONTRAST:  60mL OMNIPAQUE IOHEXOL 300 MG/ML  SOLN COMPARISON:  None. FINDINGS: Subcutaneous soft tissues: There is subcutaneous soft tissue stranding/edema and thickening involving the upper lip and overlying the right greater than left mandibles. No discrete drainable fluid collection. Dental: Caries and periapical lucencies of multiple mandibular teeth, including the second premolar and third molar on the right and the first and third molars on the  left. Pharynx and larynx: Symmetrically prominent adenoids and tonsils. No discrete mass. Salivary glands: No inflammation, mass, or stone. Thyroid: Normal. Lymph nodes: Prominent jugulodigastric nodes bilaterally. Vascular: Limited evaluation due to non arterial timing. Limited intracranial: Limited evaluation without obvious acute abnormality in the visualized brain. Visualized orbits: Negative. Mastoids and visualized paranasal sinuses: Minimal inferior right maxillary sinus mucosal thickening, likely odontogenic. Otherwise, sinuses are clear. No mastoid effusions. Skeleton: No evidence of acute abnormality. Upper chest: Visualized lung apices are clear. IMPRESSION: 1. Subcutaneous edema of the upper lip and overlying bilateral mandibles, compatible cellulitis/phlegmon. No discrete, drainable fluid collection 2. Caries and periapical lucencies of multiple mandibular teeth, including the second premolar and third molar on the right and the first and third molars on the left. 3. Prominent adenoids and tonsils, most likely hypertrophy. Recommend correlation with direct inspection. 4. Prominent jugulodigastric nodes bilaterally, nonspecific but potentially reactive  given the above findings. Electronically Signed   By: Feliberto Harts MD   On: 03/24/2021 12:22    Procedures .Critical Care  Date/Time: 03/24/2021 6:10 PM Performed by: Haskel Schroeder, PA-C Authorized by: Haskel Schroeder, PA-C   Critical care provider statement:    Critical care time (minutes):  45   Critical care was necessary to treat or prevent imminent or life-threatening deterioration of the following conditions: Facial cellulitis requiring IV antibiotics.   Critical care was time spent personally by me on the following activities:  Evaluation of patient's response to treatment, examination of patient, ordering and performing treatments and interventions, ordering and review of laboratory studies, ordering and review of radiographic studies, pulse oximetry, re-evaluation of patient's condition, obtaining history from patient or surrogate and review of old charts   Care discussed with: admitting provider     Medications Ordered in ED Medications  clindamycin (CLEOCIN) IVPB 600 mg (has no administration in time range)  lactated ringers bolus 1,000 mL (has no administration in time range)  lactated ringers infusion (has no administration in time range)  HYDROmorphone (DILAUDID) injection 1 mg (has no administration in time range)  morphine 4 MG/ML injection 4 mg (4 mg Intravenous Given 03/24/21 1046)  iohexol (OMNIPAQUE) 300 MG/ML solution 60 mL (60 mLs Intravenous Contrast Given 03/24/21 1144)  sodium chloride (PF) 0.9 % injection (  Given 03/24/21 1211)  ketorolac (TORADOL) 30 MG/ML injection 30 mg (30 mg Intravenous Given 03/24/21 1303)    ED Course  I have reviewed the triage vital signs and the nursing notes.  Pertinent labs & imaging results that were available during my care of the patient were reviewed by me and considered in my medical decision making (see chart for details).    MDM Rules/Calculators/A&P                          Ill-appearing 30 year old male in no  acute respiratory distress.  Patient presents to the emergency department with a chief complaint of facial swelling.   Physical exam as noted above.  Patient able to handle oral secretions.  Lungs clear to auscultation bilaterally.  We will obtain lab work and CT scanning with contrast of neck to evaluate for complications of patient's likely facial cellulitis.  CBC shows leukocytosis at 15.3 CMP unremarkable CT shows subcutaneous edema of the upper lip and overlying bilateral mandibles, compatible with cellulitis/phlegmon.  No discrete drainable fluid collection.  Caries and periapical lucencies of multiple mandibular teeth.  Prominent adenoids and tonsils most likely hypertrophy.  Dominant jugulodigastric nodes  bilaterally.  We will start patient on clindamycin, lactic acid and blood cultures obtained.  Will consult hospitalist team for admission.  1340 spoke to Dr. Sheppard Penton who agreed to see the patient for admission.  Final Clinical Impression(s) / ED Diagnoses Final diagnoses:  Facial cellulitis    Rx / DC Orders ED Discharge Orders     None        Haskel Schroeder, PA-C 03/24/21 1811    Arby Barrette, MD 04/12/21 [phone]

## 2021-03-24 NOTE — Consult Note (Signed)
Ophthalmology Consult  This is a 30 yo male with no PMHx who is being admitted for facial cellulitis involving upper lip and bilateral cheeks.  Pt states when was having pain involved whole face and felt like pressure.  He states has blurry vision but no different than when he saw his eye doctor.  He was told he needed glasses and they are in the mail.  No swelling of upper or lower eyelids.  CT scan showed subcutaneous edema of the upper lip and bilateral mandibles but no abscess seen. No orbital or preseptal swelling seen.  On examination pt was found to be 20/25 in both eyes with near card and +2.00 lens.  EOMI, PERRL, Full Ductions and Versions, and IOP 13 in both eyes.  No swelling of upper or lower eyelids, Sclera was white with no injection, Corneas clear, Iris round and reactive, and lenses clear.  On dilated exam c/d was 0.4 with normal appearing discs.  Retina was flat and intact with no breaks or tears.  A/P Facial Cellulitis not involving eyes.  Pt being started on IV antibiotics and pt is being admitted.  No need for further follow up with ophthalmology unless symptoms change.  Pt has an outside eye doctor who is following patient for refractive error and yearly eye exams. Refractive Error:  Pt waiting on glasses to arrive   Thank you for allowing me to participate in the care of this patient.  Please feel free to contact me if you have any concerns.  Mia Creek, M.D. (Cell) [phone] (Office) [phone]

## 2021-03-25 DIAGNOSIS — L03211 Cellulitis of face: Principal | ICD-10-CM

## 2021-03-25 LAB — CBC WITH DIFFERENTIAL/PLATELET
Abs Immature Granulocytes: 0.05 10*3/uL (ref 0.00–0.07)
Basophils Absolute: 0.1 10*3/uL (ref 0.0–0.1)
Basophils Relative: 1 %
Eosinophils Absolute: 0.3 10*3/uL (ref 0.0–0.5)
Eosinophils Relative: 3 %
HCT: 39.3 % (ref 39.0–52.0)
Hemoglobin: 13.4 g/dL (ref 13.0–17.0)
Immature Granulocytes: 0 %
Lymphocytes Relative: 12 %
Lymphs Abs: 1.6 10*3/uL (ref 0.7–4.0)
MCH: 31.2 pg (ref 26.0–34.0)
MCHC: 34.1 g/dL (ref 30.0–36.0)
MCV: 91.4 fL (ref 80.0–100.0)
Monocytes Absolute: 1.6 10*3/uL — ABNORMAL HIGH (ref 0.1–1.0)
Monocytes Relative: 12 %
Neutro Abs: 9.5 10*3/uL — ABNORMAL HIGH (ref 1.7–7.7)
Neutrophils Relative %: 72 %
Platelets: 236 10*3/uL (ref 150–400)
RBC: 4.3 MIL/uL (ref 4.22–5.81)
RDW: 12.4 % (ref 11.5–15.5)
WBC: 13.2 10*3/uL — ABNORMAL HIGH (ref 4.0–10.5)
nRBC: 0 % (ref 0.0–0.2)

## 2021-03-25 LAB — BASIC METABOLIC PANEL
Anion gap: 8 (ref 5–15)
BUN: 8 mg/dL (ref 6–20)
CO2: 28 mmol/L (ref 22–32)
Calcium: 9 mg/dL (ref 8.9–10.3)
Chloride: 102 mmol/L (ref 98–111)
Creatinine, Ser: 0.85 mg/dL (ref 0.61–1.24)
GFR, Estimated: >60 mL/min (ref >60)
Glucose, Bld: 112 mg/dL — ABNORMAL HIGH (ref 70–99)
Potassium: 3.8 mmol/L (ref 3.5–5.1)
Sodium: 138 mmol/L (ref 135–145)

## 2021-03-25 LAB — SARS CORONAVIRUS 2 (TAT 6-24 HRS): SARS Coronavirus 2: NEGATIVE

## 2021-03-25 MED ORDER — HYDRALAZINE HCL 25 MG PO TABS: 25.0000 mg | ORAL_TABLET | Freq: Four times a day (QID) | ORAL | Status: DC | PRN

## 2021-03-25 MED ORDER — IBUPROFEN 400 MG PO TABS
600.0000 mg | ORAL_TABLET | Freq: Four times a day (QID) | ORAL | Status: DC | PRN
  Administered 2021-03-25 – 2021-03-27 (×4): 600 mg via ORAL
  Filled 2021-03-25 (×4): qty 1

## 2021-03-25 NOTE — Progress Notes (Signed)
PROGRESS NOTE    Jason Sosa  EPP:[id number] DOB: 1991-08-15 DOA: 03/24/2021 PCP: Pcp, No   Brief Narrative:  HPI: Jason Sosa is a 30 y.o. male with no significant medical history who presented to ER with facial pain and swelling. Symptoms started about 3 days ago. He thinks he had an ingrown hair under his nose where swelling first started to swell and then progressed to his upper lip and bilateral cheeks. He denies any problems swallowing or breathing, but pain can make this difficult at times. He states he has blurry vision at times and feels mild pressure under the eyes. He states he had his eyes checked recently and was told he has early cataracts and has glasses coming in the mail. No recorded fevers at home, but has had chills.  Has had some headaches, but none today. No N/V/D.     ED Course: vitals: afebrile, bp: 159/109, HR: 88, RR: 16, oxygen: 99% RA. Labs with WBC: 15.3 with shift. Lactic acid: wnl. CT face: facial cellulitis. See below for description. Asked to admit for facial cellulitis.   Assessment & Plan:   Principal Problem:   Facial cellulitis Active Problems:   Leukocytosis  Facial cellulitis: Patient tells me that he believes that he is better than yesterday.  He tells me that he had swelling around his right eye yesterday which has completely disappeared today.  He still has significant swelling at upper lip.  No other complaint.  No problem with the vision.  Was also seen by ophthalmologist yesterday and was ruled out of endophthalmitis.  Continue current IV antibiotics for another day.  Tobacco abuse: Declined nicotine patch.  Elevated blood pressure, likely due to anxiety and pain.  DVT prophylaxis: enoxaparin (LOVENOX) injection 40 mg Start: 03/24/21 1800   Code Status: Full Code  Family Communication: None present at bedside.  Plan of care discussed with patient in length and he verbalized understanding and agreed with it.  Status is: Inpatient  Remains  inpatient appropriate because:Inpatient level of care appropriate due to severity of illness  Dispo: The patient is from: Home              Anticipated d/c is to: Home              Patient currently is not medically stable to d/c.   Difficult to place patient No        Estimated body mass index is 32.08 kg/m as calculated from the following:   Height as of this encounter: 5\' 11"  (1.803 m).   Weight as of this encounter: 104.3 kg.      Nutritional status:               Consultants:  none  Procedures:  none  Antimicrobials:  Anti-infectives (From admission, onward)    Start     Dose/Rate Route Frequency Ordered Stop   03/25/21 0400  vancomycin (VANCOREADY) IVPB 1250 mg/250 mL        1,250 mg 166.7 mL/hr over 90 Minutes Intravenous Every 12 hours 03/24/21 1532     03/24/21 1430  cefTRIAXone (ROCEPHIN) 2 g in sodium chloride 0.9 % 100 mL IVPB        2 g 200 mL/hr over 30 Minutes Intravenous Every 24 hours 03/24/21 1403     03/24/21 1430  vancomycin (VANCOREADY) IVPB 2000 mg/400 mL        2,000 mg 200 mL/hr over 120 Minutes Intravenous  Once 03/24/21 1419 03/24/21 1730   03/24/21  1245  clindamycin (CLEOCIN) IVPB 600 mg        600 mg 100 mL/hr over 30 Minutes Intravenous  Once 03/24/21 1242 03/24/21 1413          Subjective: Seen and examined.  She has better than yesterday.  No other complaint.  Objective: Vitals:   03/24/21 2214 03/25/21 0127 03/25/21 0511 03/25/21 0918  BP: 115/75 137/84 (!) 136/95 (!) 155/100  Pulse: 75 74 64 61  Resp: 16 16 17 16   Temp: 98.9 F (37.2 C) 98.7 F (37.1 C) 99.4 F (37.4 C) 99 F (37.2 C)  TempSrc: Oral Oral Oral Oral  SpO2: 98% 98% 97% 98%  Weight:      Height:        Intake/Output Summary (Last 24 hours) at 03/25/2021 1029 Last data filed at 03/25/2021 0200 Gross per 24 hour  Intake 1673.63 ml  Output --  Net 1673.63 ml   Filed Weights   03/24/21 1101  Weight: 104.3 kg    Examination:  General  exam: Appears calm and comfortable  Respiratory system: Clear to auscultation. Respiratory effort normal. Cardiovascular system: S1 & S2 heard, RRR. No JVD, murmurs, rubs, gallops or clicks. No pedal edema. Gastrointestinal system: Abdomen is nondistended, soft and nontender. No organomegaly or masses felt. Normal bowel sounds heard. Central nervous system: Alert and oriented. No focal neurological deficits. Extremities: Symmetric 5 x 5 power. Skin: Swelling with mild erythema around upper lip. Psychiatry: Judgement and insight appear normal. Mood & affect appropriate.       Data Reviewed: I have personally reviewed following labs and imaging studies  CBC: Recent Labs  Lab 03/24/21 1035 03/25/21 0252  WBC 15.3* 13.2*  NEUTROABS 11.8* 9.5*  HGB 14.2 13.4  HCT 41.2 39.3  MCV 90.4 91.4  PLT 260 236   Basic Metabolic Panel: Recent Labs  Lab 03/24/21 1035 03/25/21 0252  NA 138 138  K 3.7 3.8  CL 103 102  CO2 26 28  GLUCOSE 121* 112*  BUN 8 8  CREATININE 0.93 0.85  CALCIUM 9.3 9.0   GFR: Estimated Creatinine Clearance: 156.2 mL/min (by C-G formula based on SCr of 0.85 mg/dL). Liver Function Tests: No results for input(s): AST, ALT, ALKPHOS, BILITOT, PROT, ALBUMIN in the last 168 hours. No results for input(s): LIPASE, AMYLASE in the last 168 hours. No results for input(s): AMMONIA in the last 168 hours. Coagulation Profile: No results for input(s): INR, PROTIME in the last 168 hours. Cardiac Enzymes: No results for input(s): CKTOTAL, CKMB, CKMBINDEX, TROPONINI in the last 168 hours. BNP (last 3 results) No results for input(s): PROBNP in the last 8760 hours. HbA1C: Recent Labs    03/24/21 1035  HGBA1C 5.1   CBG: No results for input(s): GLUCAP in the last 168 hours. Lipid Profile: No results for input(s): CHOL, HDL, LDLCALC, TRIG, CHOLHDL, LDLDIRECT in the last 72 hours. Thyroid Function Tests: No results for input(s): TSH, T4TOTAL, FREET4, T3FREE, THYROIDAB  in the last 72 hours. Anemia Panel: No results for input(s): VITAMINB12, FOLATE, FERRITIN, TIBC, IRON, RETICCTPCT in the last 72 hours. Sepsis Labs: Recent Labs  Lab 03/24/21 1324 03/24/21 1441  LATICACIDVEN 1.3 1.0    Recent Results (from the past 240 hour(s))  Culture, blood (routine x 2)     Status: None (Preliminary result)   Collection Time: 03/24/21  1:01 PM   Specimen: BLOOD  Result Value Ref Range Status   Specimen Description   Final    BLOOD LEFT ANTECUBITAL Performed at  [facility], [address] [place], [place] [address]    Special Requests   Final    BOTTLES DRAWN AEROBIC AND ANAEROBIC Blood Culture adequate volume Performed at [facility], [address] [place], [place] [address]    Culture   Final    NO GROWTH < 24 HOURS Performed at [facility], [address] [place], [place] [address]    Report Status PENDING  Incomplete  Culture, blood (routine x 2)     Status: None (Preliminary result)   Collection Time: 03/24/21  1:12 PM   Specimen: BLOOD LEFT HAND  Result Value Ref Range Status   Specimen Description   Final    BLOOD LEFT HAND Performed at [facility], [address], [place], [place] [address]    Special Requests   Final    BOTTLES DRAWN AEROBIC AND ANAEROBIC Blood Culture adequate volume Performed at [facility], [address], [place], [place] [address]    Culture   Final    NO GROWTH < 24 HOURS Performed at [facility], [address] [place], [place] [address]    Report Status PENDING  Incomplete  SARS CORONAVIRUS 2 (TAT 6-24 HRS) Nasopharyngeal Nasopharyngeal Swab     Status: None   Collection Time: 03/24/21  2:54 PM   Specimen: Nasopharyngeal Swab  Result Value Ref Range Status   SARS Coronavirus 2 NEGATIVE NEGATIVE Final    Comment: (NOTE) SARS-CoV-2 target nucleic acids are NOT DETECTED.  The SARS-CoV-2 RNA is generally detectable in upper and  lower respiratory specimens during the acute phase of infection. Negative results do not preclude SARS-CoV-2 infection, do not rule out co-infections with other pathogens, and should not be used as the sole basis for treatment or other patient management decisions. Negative results must be combined with clinical observations, patient history, and epidemiological information. The expected result is Negative.  Fact Sheet for Patients: HairSlick.no  Fact Sheet for Healthcare Providers: quierodirigir.com  This test is not yet approved or cleared by the [place] FDA and  has been authorized for detection and/or diagnosis of SARS-CoV-2 by FDA under an Emergency Use Authorization (EUA). This EUA will remain  in effect (meaning this test can be used) for the duration of the COVID-19 declaration under Se ction 564(b)(1) of the Act, 21 U.S.C. section 360bbb-3(b)(1), unless the authorization is terminated or revoked sooner.  Performed at [facility], [address] [place], [place] [address]       Radiology Studies: CT Soft Tissue Neck W Contrast  Result Date: 03/24/2021 CLINICAL DATA:  Concern for neck abscess. Swelling of the right side of face with difficulty swelling for about 3 days. EXAM: CT NECK WITH CONTRAST TECHNIQUE: Multidetector CT imaging of the neck was performed using the standard protocol following the bolus administration of intravenous contrast. CONTRAST:  81mL OMNIPAQUE IOHEXOL 300 MG/ML  SOLN COMPARISON:  None. FINDINGS: Subcutaneous soft tissues: There is subcutaneous soft tissue stranding/edema and thickening involving the upper lip and overlying the right greater than left mandibles. No discrete drainable fluid collection. Dental: Caries and periapical lucencies of multiple mandibular teeth, including the second premolar and third molar on the right and the first and third molars on the left. Pharynx and  larynx: Symmetrically prominent adenoids and tonsils. No discrete mass. Salivary glands: No inflammation, mass, or stone. Thyroid: Normal. Lymph nodes: Prominent jugulodigastric nodes bilaterally. Vascular: Limited evaluation due to non arterial timing.  Limited intracranial: Limited evaluation without obvious acute abnormality in the visualized brain. Visualized orbits: Negative. Mastoids and visualized paranasal sinuses: Minimal inferior right maxillary sinus mucosal thickening, likely odontogenic. Otherwise, sinuses are clear. No mastoid effusions. Skeleton: No evidence of acute abnormality. Upper chest: Visualized lung apices are clear. IMPRESSION: 1. Subcutaneous edema of the upper lip and overlying bilateral mandibles, compatible cellulitis/phlegmon. No discrete, drainable fluid collection 2. Caries and periapical lucencies of multiple mandibular teeth, including the second premolar and third molar on the right and the first and third molars on the left. 3. Prominent adenoids and tonsils, most likely hypertrophy. Recommend correlation with direct inspection. 4. Prominent jugulodigastric nodes bilaterally, nonspecific but potentially reactive given the above findings. Electronically Signed   By: Feliberto Harts MD   On: 03/24/2021 12:22    Scheduled Meds:  enoxaparin (LOVENOX) injection  40 mg Subcutaneous Q24H   sodium chloride flush  3 mL Intravenous Q12H   Continuous Infusions:  sodium chloride     sodium chloride 10 mL/hr at 03/24/21 1806   cefTRIAXone (ROCEPHIN)  IV 2 g (03/24/21 1446)   vancomycin 1,250 mg (03/25/21 0442)     LOS: 1 day   Time spent: 32 minutes   Hughie Closs, MD [facility]  03/25/2021, 10:29 AM   How to contact the [facility] Attending or Consulting provider 7A - 7P or covering provider during after hours 7P -7A, for this patient?  Check the care team in [facility] and look for a) attending/consulting TRH provider listed and b) the [facility] team listed. Page or secure chat  7A-7P. Log into [email or web address] and use [place]'s universal password to access. If you do not have the password, please contact the hospital operator. Locate the [facility] provider you are looking for under [facility] and page to a number that you can be directly reached. If you still have difficulty reaching the provider, please page the [facility] (Director on Call) for the Hospitalists listed on amion for assistance.

## 2021-03-26 LAB — CBC WITH DIFFERENTIAL/PLATELET
Abs Immature Granulocytes: 0.06 10*3/uL (ref 0.00–0.07)
Basophils Absolute: 0.1 10*3/uL (ref 0.0–0.1)
Basophils Relative: 1 %
Eosinophils Absolute: 0.4 10*3/uL (ref 0.0–0.5)
Eosinophils Relative: 3 %
HCT: 40.8 % (ref 39.0–52.0)
Hemoglobin: 14 g/dL (ref 13.0–17.0)
Immature Granulocytes: 1 %
Lymphocytes Relative: 11 %
Lymphs Abs: 1.4 10*3/uL (ref 0.7–4.0)
MCH: 30.9 pg (ref 26.0–34.0)
MCHC: 34.3 g/dL (ref 30.0–36.0)
MCV: 90.1 fL (ref 80.0–100.0)
Monocytes Absolute: 1.4 10*3/uL — ABNORMAL HIGH (ref 0.1–1.0)
Monocytes Relative: 11 %
Neutro Abs: 8.9 10*3/uL — ABNORMAL HIGH (ref 1.7–7.7)
Neutrophils Relative %: 73 %
Platelets: 253 10*3/uL (ref 150–400)
RBC: 4.53 MIL/uL (ref 4.22–5.81)
RDW: 12 % (ref 11.5–15.5)
WBC: 12.1 10*3/uL — ABNORMAL HIGH (ref 4.0–10.5)
nRBC: 0 % (ref 0.0–0.2)

## 2021-03-26 MED ORDER — LIP MEDEX EX OINT
TOPICAL_OINTMENT | CUTANEOUS | Status: AC
  Administered 2021-03-26: 1
  Filled 2021-03-26: qty 7

## 2021-03-26 NOTE — Plan of Care (Signed)
Plan of care reviewed and discussed with the patient. 

## 2021-03-26 NOTE — Plan of Care (Signed)
  Problem: Clinical Measurements: Goal: Ability to maintain clinical measurements within normal limits will improve Outcome: Progressing   Problem: Activity: Goal: Risk for activity intolerance will decrease Outcome: Progressing   Problem: Coping: Goal: Level of anxiety will decrease Outcome: Progressing   Problem: Elimination: Goal: Will not experience complications related to bowel motility Outcome: Progressing   Problem: Pain Managment: Goal: General experience of comfort will improve Outcome: Progressing   Problem: Skin Integrity: Goal: Risk for impaired skin integrity will decrease Outcome: Progressing   

## 2021-03-26 NOTE — Progress Notes (Signed)
PROGRESS NOTE    Jason Sosa  TWS:[id number] DOB: 08-Sep-1991 DOA: 03/24/2021 PCP: Pcp, No   Brief Narrative:  Jason Sosa is a 30 y.o. male with no significant medical history who presented to ER with facial pain and swelling for 3 days duration. He thinks he had an ingrown hair under his nose where swelling first started to swell and then progressed to his upper lip and bilateral cheeks. He also complained of blurry vision at times and feels mild pressure under the eyes. He states he had his eyes checked recently and was told he has early cataracts and has glasses coming in the mail. No recorded fevers at home, but has had chills.    Upon arrival to ED, he was hemodynamically stable with WBC: 15.3 with shift. Lactic acid: wnl. CT face: facial cellulitis but no drainable abscess.  He was started on broad-spectrum antibiotics and admitted under hospitalist service.  Seen by ophthalmologist who ruled out endophthalmitis.  Assessment & Plan:   Principal Problem:   Facial cellulitis Active Problems:   Leukocytosis  Facial cellulitis: Does not feel much different from yesterday.  No better or worse.  Still has significant upper lip swelling which is also noted.  Compared to yesterday.  Has remained afebrile.  Leukocytosis stable.  We will continue with IV antibiotics.  Reassess tomorrow morning.  Tobacco abuse: Declined nicotine patch.  Elevated blood pressure: now controlled, was likely due to anxiety and pain.  DVT prophylaxis: enoxaparin (LOVENOX) injection 40 mg Start: 03/24/21 1800   Code Status: Full Code  Family Communication: None present at bedside.  Plan of care discussed with patient in length and he verbalized understanding and agreed with it.  Status is: Inpatient  Remains inpatient appropriate because:Inpatient level of care appropriate due to severity of illness  Dispo: The patient is from: Home              Anticipated d/c is to: Home              Patient currently is  not medically stable to d/c.   Difficult to place patient No        Estimated body mass index is 32.08 kg/m as calculated from the following:   Height as of this encounter: 5\' 11"  (1.803 m).   Weight as of this encounter: 104.3 kg.      Nutritional status:               Consultants:  Ophthalmology  Procedures:  none  Antimicrobials:  Anti-infectives (From admission, onward)    Start     Dose/Rate Route Frequency Ordered Stop   03/25/21 0400  vancomycin (VANCOREADY) IVPB 1250 mg/250 mL        1,250 mg 166.7 mL/hr over 90 Minutes Intravenous Every 12 hours 03/24/21 1532     03/24/21 1430  cefTRIAXone (ROCEPHIN) 2 g in sodium chloride 0.9 % 100 mL IVPB        2 g 200 mL/hr over 30 Minutes Intravenous Every 24 hours 03/24/21 1403     03/24/21 1430  vancomycin (VANCOREADY) IVPB 2000 mg/400 mL        2,000 mg 200 mL/hr over 120 Minutes Intravenous  Once 03/24/21 1419 03/24/21 1730   03/24/21 1245  clindamycin (CLEOCIN) IVPB 600 mg        600 mg 100 mL/hr over 30 Minutes Intravenous  Once 03/24/21 1242 03/24/21 1413          Subjective: Seen and examined.  Does not  feel any different than yesterday.  No new complaint.  Objective: Vitals:   03/25/21 0918 03/25/21 1416 03/25/21 2139 03/26/21 0611  BP: (!) 155/100 (!) 122/91 (!) 138/96 (!) 123/94  Pulse: 61 75 82 70  Resp: 16 16 18 18   Temp: 99 F (37.2 C) 98.6 F (37 C) 98.1 F (36.7 C) 97.9 F (36.6 C)  TempSrc: Oral Oral Oral Oral  SpO2: 98% 96% 99% 98%  Weight:      Height:        Intake/Output Summary (Last 24 hours) at 03/26/2021 1159 Last data filed at 03/26/2021 1020 Gross per 24 hour  Intake 1566.2 ml  Output 2640 ml  Net -1073.8 ml    Filed Weights   03/24/21 1101  Weight: 104.3 kg    Examination:  General exam: Appears calm and comfortable  Respiratory system: Clear to auscultation. Respiratory effort normal. Cardiovascular system: S1 & S2 heard, RRR. No JVD, murmurs, rubs,  gallops or clicks. No pedal edema. Gastrointestinal system: Abdomen is nondistended, soft and nontender. No organomegaly or masses felt. Normal bowel sounds heard. Central nervous system: Alert and oriented. No focal neurological deficits. Extremities: Symmetric 5 x 5 power. Skin: As pictured below. Psychiatry: Judgement and insight appear normal. Mood & affect appropriate.        Data Reviewed: I have personally reviewed following labs and imaging studies  CBC: Recent Labs  Lab 03/24/21 1035 03/25/21 0252 03/26/21 0327  WBC 15.3* 13.2* 12.1*  NEUTROABS 11.8* 9.5* 8.9*  HGB 14.2 13.4 14.0  HCT 41.2 39.3 40.8  MCV 90.4 91.4 90.1  PLT 260 236 253    Basic Metabolic Panel: Recent Labs  Lab 03/24/21 1035 03/25/21 0252  NA 138 138  K 3.7 3.8  CL 103 102  CO2 26 28  GLUCOSE 121* 112*  BUN 8 8  CREATININE 0.93 0.85  CALCIUM 9.3 9.0    GFR: Estimated Creatinine Clearance: 156.2 mL/min (by C-G formula based on SCr of 0.85 mg/dL). Liver Function Tests: No results for input(s): AST, ALT, ALKPHOS, BILITOT, PROT, ALBUMIN in the last 168 hours. No results for input(s): LIPASE, AMYLASE in the last 168 hours. No results for input(s): AMMONIA in the last 168 hours. Coagulation Profile: No results for input(s): INR, PROTIME in the last 168 hours. Cardiac Enzymes: No results for input(s): CKTOTAL, CKMB, CKMBINDEX, TROPONINI in the last 168 hours. BNP (last 3 results) No results for input(s): PROBNP in the last 8760 hours. HbA1C: Recent Labs    03/24/21 1035  HGBA1C 5.1    CBG: No results for input(s): GLUCAP in the last 168 hours. Lipid Profile: No results for input(s): CHOL, HDL, LDLCALC, TRIG, CHOLHDL, LDLDIRECT in the last 72 hours. Thyroid Function Tests: No results for input(s): TSH, T4TOTAL, FREET4, T3FREE, THYROIDAB in the last 72 hours. Anemia Panel: No results for input(s): VITAMINB12, FOLATE, FERRITIN, TIBC, IRON, RETICCTPCT in the last 72 hours. Sepsis  Labs: Recent Labs  Lab 03/24/21 1324 03/24/21 1441  LATICACIDVEN 1.3 1.0     Recent Results (from the past 240 hour(s))  Culture, blood (routine x 2)     Status: None (Preliminary result)   Collection Time: 03/24/21  1:01 PM   Specimen: BLOOD  Result Value Ref Range Status   Specimen Description   Final    BLOOD LEFT ANTECUBITAL Performed at [facility], [address] [place], [place]    Special Requests   Final    BOTTLES DRAWN AEROBIC AND ANAEROBIC Blood Culture  adequate volume Performed at [facility], [address], [place], [place] [address]    Culture   Final    NO GROWTH 2 DAYS Performed at [facility], [address], [place], [place] [address]    Report Status PENDING  Incomplete  Culture, blood (routine x 2)     Status: None (Preliminary result)   Collection Time: 03/24/21  1:12 PM   Specimen: BLOOD LEFT HAND  Result Value Ref Range Status   Specimen Description   Final    BLOOD LEFT HAND Performed at [facility], [address] [place], [place] [address]    Special Requests   Final    BOTTLES DRAWN AEROBIC AND ANAEROBIC Blood Culture adequate volume Performed at [facility] [address] [place], [place] [address]    Culture   Final    NO GROWTH 2 DAYS Performed at [facility], [address] [place], [place] [address]    Report Status PENDING  Incomplete  SARS CORONAVIRUS 2 (TAT 6-24 HRS) Nasopharyngeal Nasopharyngeal Swab     Status: None   Collection Time: 03/24/21  2:54 PM   Specimen: Nasopharyngeal Swab  Result Value Ref Range Status   SARS Coronavirus 2 NEGATIVE NEGATIVE Final    Comment: (NOTE) SARS-CoV-2 target nucleic acids are NOT DETECTED.  The SARS-CoV-2 RNA is generally detectable in upper and lower respiratory specimens during the acute phase of infection. Negative results do not preclude SARS-CoV-2 infection, do not rule out co-infections  with other pathogens, and should not be used as the sole basis for treatment or other patient management decisions. Negative results must be combined with clinical observations, patient history, and epidemiological information. The expected result is Negative.  Fact Sheet for Patients: HairSlick.no  Fact Sheet for Healthcare Providers: quierodirigir.com  This test is not yet approved or cleared by the [place] FDA and  has been authorized for detection and/or diagnosis of SARS-CoV-2 by FDA under an Emergency Use Authorization (EUA). This EUA will remain  in effect (meaning this test can be used) for the duration of the COVID-19 declaration under Se ction 564(b)(1) of the Act, 21 U.S.C. section 360bbb-3(b)(1), unless the authorization is terminated or revoked sooner.  Performed at [facility], [address], [place], [place] [address]        Radiology Studies: No results found.  Scheduled Meds:  enoxaparin (LOVENOX) injection  40 mg Subcutaneous Q24H   sodium chloride flush  3 mL Intravenous Q12H   Continuous Infusions:  sodium chloride     sodium chloride 10 mL/hr at 03/25/21 1438   cefTRIAXone (ROCEPHIN)  IV 2 g (03/25/21 1440)   vancomycin 1,250 mg (03/26/21 0402)     LOS: 2 days   Time spent: 28 minutes   Hughie Closs, MD [facility]  03/26/2021, 11:59 AM   How to contact the [facility] Attending or Consulting provider 7A - 7P or covering provider during after hours 7P -7A, for this patient?  Check the care team in [facility] and look for a) attending/consulting TRH provider listed and b) the [facility] team listed. Page or secure chat 7A-7P. Log into [email or web address] and use [place]'s universal password to access. If you do not have the password, please contact the hospital operator. Locate the [facility] provider you are looking for under [facility] and page to a number that you can be directly  reached. If you still have difficulty reaching the provider, please  page the [facility] (Director on Call) for the Hospitalists listed on amion for assistance.

## 2021-03-27 LAB — CBC WITH DIFFERENTIAL/PLATELET
Abs Immature Granulocytes: 0.05 10*3/uL (ref 0.00–0.07)
Basophils Absolute: 0.1 10*3/uL (ref 0.0–0.1)
Basophils Relative: 1 %
Eosinophils Absolute: 0.4 10*3/uL (ref 0.0–0.5)
Eosinophils Relative: 5 %
HCT: 42.3 % (ref 39.0–52.0)
Hemoglobin: 14.7 g/dL (ref 13.0–17.0)
Immature Granulocytes: 1 %
Lymphocytes Relative: 18 %
Lymphs Abs: 1.5 10*3/uL (ref 0.7–4.0)
MCH: 31.3 pg (ref 26.0–34.0)
MCHC: 34.8 g/dL (ref 30.0–36.0)
MCV: 90 fL (ref 80.0–100.0)
Monocytes Absolute: 1.3 10*3/uL — ABNORMAL HIGH (ref 0.1–1.0)
Monocytes Relative: 16 %
Neutro Abs: 5.1 10*3/uL (ref 1.7–7.7)
Neutrophils Relative %: 59 %
Platelets: 311 10*3/uL (ref 150–400)
RBC: 4.7 MIL/uL (ref 4.22–5.81)
RDW: 11.9 % (ref 11.5–15.5)
WBC: 8.5 10*3/uL (ref 4.0–10.5)
nRBC: 0 % (ref 0.0–0.2)

## 2021-03-27 MED ORDER — SULFAMETHOXAZOLE-TRIMETHOPRIM 800-160 MG PO TABS: 1.0000 | ORAL_TABLET | Freq: Two times a day (BID) | ORAL | 0 refills | Status: AC

## 2021-03-27 NOTE — Progress Notes (Signed)
Provided discharge education/instructions, all questions and concerns addressed, Pt not in distress, discharged home with belongings accompanied by his mother. 

## 2021-03-27 NOTE — Plan of Care (Signed)
  Problem: Clinical Measurements: Goal: Ability to maintain clinical measurements within normal limits will improve Outcome: Progressing   Problem: Coping: Goal: Level of anxiety will decrease Outcome: Progressing   Problem: Elimination: Goal: Will not experience complications related to bowel motility Outcome: Progressing   Problem: Pain Managment: Goal: General experience of comfort will improve Outcome: Progressing   Problem: Skin Integrity: Goal: Risk for impaired skin integrity will decrease Outcome: Progressing

## 2021-03-27 NOTE — Discharge Summary (Signed)
Physician Discharge Summary  Jason Sosa ENI:[id number] DOB: [birth year] DOA: 03/24/2021  PCP: Pcp, No  Admit date: 03/24/2021 Discharge date: 03/27/2021 30 Day Unplanned Readmission Risk Score    Flowsheet Row ED to Hosp-Admission (Current) from 03/24/2021 in [facility]  30 Day Unplanned Readmission Risk Score (%) 4.99 Filed at 03/27/2021 0801       This score is the patient's risk of an unplanned readmission within 30 days of being discharged (0 -100%). The score is based on dignosis, age, lab data, medications, orders, and past utilization.   Low:  0-14.9   Medium: 15-21.9   High: 22-29.9   Extreme: 30 and above           Admitted From: Home Disposition: Home  Recommendations for Outpatient Follow-up:  Follow up with PCP in 1-2 weeks Please obtain BMP/CBC in one week Please follow up with your PCP on the following pending results: Unresulted Labs (From admission, onward)     Start     Ordered   03/31/21 0500  Creatinine, serum  (enoxaparin (LOVENOX)    CrCl >/= 30 ml/min)  Weekly,   R     Comments: while on enoxaparin therapy    03/24/21 1405   03/26/21 0500  CBC with Differential/Platelet  Daily,   R      03/25/21 1035              Home Health: None Equipment/Devices: None  Discharge Condition: Stable CODE STATUS: Full code Diet recommendation: Regular  Subjective: Seen and examined.  He states that he feels much better.  Does not feel as tight in his upper lip as he did yesterday.  Able to tolerate regular diet as well.  Pain controlled.  Brief/Interim Summary: Jason Sosa is a 30 y.o. male with no significant medical history who presented to ER with facial pain and swelling for 3 days duration. He thinks he had an ingrown hair under his nose where swelling first started to swell and then progressed to his upper lip and bilateral cheeks. He also complained of blurry vision at times and feels mild pressure under the eyes. He states he had his  eyes checked recently and was told he has early cataracts and has glasses coming in the mail. No recorded fevers at home, but has had chills.     Upon arrival to ED, he was hemodynamically stable with WBC: 15.3 with shift. Lactic acid: wnl. CT face: facial cellulitis but no drainable abscess.  He was started on broad-spectrum antibiotics and admitted under hospitalist service.  Seen by ophthalmologist who ruled out endophthalmitis.  Continued on Rocephin and vancomycin.  Over the course of last 3 days, he has improved significantly.  On examination, his upper lip is now soft compared to from as it was yesterday and patient is also feeling much better and able to tolerate diet.  His facial edema/cellulitis around right eye has completely resolved now.  His leukocytosis resolved.  He has remained afebrile.  He is ready for discharge today.  I am discharging him on 10 days of oral Bactrim DS.  He does not have any PCP.  I have strongly advised him that he needs to establish care with a PCP and needs to follow-up with them within a week and needs to continue to hydrate him as long as he is on Bactrim DS.  He verbalized understanding.  Discharge Diagnoses:  Principal Problem:   Facial cellulitis Active Problems:   Leukocytosis    Discharge  Instructions   Allergies as of 03/27/2021       Reactions   Penicillins Hives   Has patient had a PCN reaction causing immediate rash, facial/tongue/throat swelling, SOB or lightheadedness with hypotension: Yes Has patient had a PCN reaction causing severe rash involving mucus membranes or skin necrosis: No Has patient had a PCN reaction that required hospitalization: No Has patient had a PCN reaction occurring within the last 10 years: No If all of the above answers are "NO", then may proceed with Cephalosporin use.        Medication List     STOP taking these medications    folic acid 1 MG tablet Commonly known as: FOLVITE   thiamine 100 MG tablet        TAKE these medications    ibuprofen 200 MG tablet Commonly known as: ADVIL Take 800 mg by mouth every 6 (six) hours as needed for headache, fever or mild pain.   sulfamethoxazole-trimethoprim 800-160 MG tablet Commonly known as: BACTRIM DS Take 1 tablet by mouth 2 (two) times daily for 10 days.        Allergies  Allergen Reactions   Penicillins Hives    Has patient had a PCN reaction causing immediate rash, facial/tongue/throat swelling, SOB or lightheadedness with hypotension: Yes Has patient had a PCN reaction causing severe rash involving mucus membranes or skin necrosis: No Has patient had a PCN reaction that required hospitalization: No Has patient had a PCN reaction occurring within the last 10 years: No If all of the above answers are "NO", then may proceed with Cephalosporin use.     Consultations: None   Procedures/Studies: CT Soft Tissue Neck W Contrast  Result Date: 03/24/2021 CLINICAL DATA:  Concern for neck abscess. Swelling of the right side of face with difficulty swelling for about 3 days. EXAM: CT NECK WITH CONTRAST TECHNIQUE: Multidetector CT imaging of the neck was performed using the standard protocol following the bolus administration of intravenous contrast. CONTRAST:  16mL OMNIPAQUE IOHEXOL 300 MG/ML  SOLN COMPARISON:  None. FINDINGS: Subcutaneous soft tissues: There is subcutaneous soft tissue stranding/edema and thickening involving the upper lip and overlying the right greater than left mandibles. No discrete drainable fluid collection. Dental: Caries and periapical lucencies of multiple mandibular teeth, including the second premolar and third molar on the right and the first and third molars on the left. Pharynx and larynx: Symmetrically prominent adenoids and tonsils. No discrete mass. Salivary glands: No inflammation, mass, or stone. Thyroid: Normal. Lymph nodes: Prominent jugulodigastric nodes bilaterally. Vascular: Limited evaluation due to non  arterial timing. Limited intracranial: Limited evaluation without obvious acute abnormality in the visualized brain. Visualized orbits: Negative. Mastoids and visualized paranasal sinuses: Minimal inferior right maxillary sinus mucosal thickening, likely odontogenic. Otherwise, sinuses are clear. No mastoid effusions. Skeleton: No evidence of acute abnormality. Upper chest: Visualized lung apices are clear. IMPRESSION: 1. Subcutaneous edema of the upper lip and overlying bilateral mandibles, compatible cellulitis/phlegmon. No discrete, drainable fluid collection 2. Caries and periapical lucencies of multiple mandibular teeth, including the second premolar and third molar on the right and the first and third molars on the left. 3. Prominent adenoids and tonsils, most likely hypertrophy. Recommend correlation with direct inspection. 4. Prominent jugulodigastric nodes bilaterally, nonspecific but potentially reactive given the above findings. Electronically Signed   By: Feliberto Harts MD   On: 03/24/2021 12:22     Discharge Exam: Vitals:   03/26/21 2145 03/27/21 0529  BP: (!) 139/92 122/85  Pulse: 75  62  Resp: 17 17  Temp: 98.7 F (37.1 C) 97.9 F (36.6 C)  SpO2: 99% 100%   Vitals:   03/26/21 0611 03/26/21 1317 03/26/21 2145 03/27/21 0529  BP: (!) 123/94 131/81 (!) 139/92 122/85  Pulse: 70 88 75 62  Resp: 18 17 17 17   Temp: 97.9 F (36.6 C) 97.9 F (36.6 C) 98.7 F (37.1 C) 97.9 F (36.6 C)  TempSrc: Oral Oral Oral Oral  SpO2: 98% 96% 99% 100%  Weight:      Height:        General: Pt is alert, awake, not in acute distress Cardiovascular: RRR, S1/S2 +, no rubs, no gallops Respiratory: CTA bilaterally, no wheezing, no rhonchi Abdominal: Soft, NT, ND, bowel sounds + Extremities: no edema, no cyanosis HEENT: Mild edema at upper lip with very minimal erythema.    The results of significant diagnostics from this hospitalization (including imaging, microbiology, ancillary and  laboratory) are listed below for reference.     Microbiology: Recent Results (from the past 240 hour(s))  Culture, blood (routine x 2)     Status: None (Preliminary result)   Collection Time: 03/24/21  1:01 PM   Specimen: BLOOD  Result Value Ref Range Status   Specimen Description   Final    BLOOD LEFT ANTECUBITAL Performed at [facility], [address] [place], [place] [address]    Special Requests   Final    BOTTLES DRAWN AEROBIC AND ANAEROBIC Blood Culture adequate volume Performed at [facility], [address] [place], [place] [address]    Culture   Final    NO GROWTH 3 DAYS Performed at [facility], [address] [place], [place] [address]    Report Status PENDING  Incomplete  Culture, blood (routine x 2)     Status: None (Preliminary result)   Collection Time: 03/24/21  1:12 PM   Specimen: BLOOD LEFT HAND  Result Value Ref Range Status   Specimen Description   Final    BLOOD LEFT HAND Performed at [facility], [address] [place], [place] [address]    Special Requests   Final    BOTTLES DRAWN AEROBIC AND ANAEROBIC Blood Culture adequate volume Performed at [facility], [address] [place], [place] [address]    Culture   Final    NO GROWTH 3 DAYS Performed at [facility], [address] [place], [place] [address]    Report Status PENDING  Incomplete  SARS CORONAVIRUS 2 (TAT 6-24 HRS) Nasopharyngeal Nasopharyngeal Swab     Status: None   Collection Time: 03/24/21  2:54 PM   Specimen: Nasopharyngeal Swab  Result Value Ref Range Status   SARS Coronavirus 2 NEGATIVE NEGATIVE Final    Comment: (NOTE) SARS-CoV-2 target nucleic acids are NOT DETECTED.  The SARS-CoV-2 RNA is generally detectable in upper and lower respiratory specimens during the acute phase of infection. Negative results do not preclude SARS-CoV-2 infection, do not rule out co-infections with other  pathogens, and should not be used as the sole basis for treatment or other patient management decisions. Negative results must be combined with clinical observations, patient history, and epidemiological information. The expected result is Negative.  Fact Sheet for Patients: HairSlick.nohttps://www.fda.gov/media/138098/download  Fact Sheet for Healthcare Providers: quierodirigir.comhttps://www.fda.gov/media/138095/download  This test is not yet approved or cleared by the [place] FDA and  has been authorized for detection and/or diagnosis of SARS-CoV-2 by FDA under an Emergency Use  Authorization (EUA). This EUA will remain  in effect (meaning this test can be used) for the duration of the COVID-19 declaration under Se ction 564(b)(1) of the Act, 21 U.S.C. section 360bbb-3(b)(1), unless the authorization is terminated or revoked sooner.  Performed at [facility], [address] [place], [place] [address]      Labs: BNP (last 3 results) No results for input(s): BNP in the last 8760 hours. Basic Metabolic Panel: Recent Labs  Lab 03/24/21 1035 03/25/21 0252  NA 138 138  K 3.7 3.8  CL 103 102  CO2 26 28  GLUCOSE 121* 112*  BUN 8 8  CREATININE 0.93 0.85  CALCIUM 9.3 9.0   Liver Function Tests: No results for input(s): AST, ALT, ALKPHOS, BILITOT, PROT, ALBUMIN in the last 168 hours. No results for input(s): LIPASE, AMYLASE in the last 168 hours. No results for input(s): AMMONIA in the last 168 hours. CBC: Recent Labs  Lab 03/24/21 1035 03/25/21 0252 03/26/21 0327 03/27/21 0325  WBC 15.3* 13.2* 12.1* 8.5  NEUTROABS 11.8* 9.5* 8.9* 5.1  HGB 14.2 13.4 14.0 14.7  HCT 41.2 39.3 40.8 42.3  MCV 90.4 91.4 90.1 90.0  PLT 260 236 253 311   Cardiac Enzymes: No results for input(s): CKTOTAL, CKMB, CKMBINDEX, TROPONINI in the last 168 hours. BNP: Invalid input(s): POCBNP CBG: No results for input(s): GLUCAP in the last 168 hours. D-Dimer No results for input(s): DDIMER in the last 72  hours. Hgb A1c Recent Labs    03/24/21 1035  HGBA1C 5.1   Lipid Profile No results for input(s): CHOL, HDL, LDLCALC, TRIG, CHOLHDL, LDLDIRECT in the last 72 hours. Thyroid function studies No results for input(s): TSH, T4TOTAL, T3FREE, THYROIDAB in the last 72 hours.  Invalid input(s): FREET3 Anemia work up No results for input(s): VITAMINB12, FOLATE, FERRITIN, TIBC, IRON, RETICCTPCT in the last 72 hours. Urinalysis    Component Value Date/Time   COLORURINE AMBER (A) 05/17/2013 2059   APPEARANCEUR CLOUDY (A) 05/17/2013 2059   LABSPEC 1.031 (H) 05/17/2013 2059   PHURINE 6.0 05/17/2013 2059   GLUCOSEU NEGATIVE 05/17/2013 2059   HGBUR LARGE (A) 05/17/2013 2059   BILIRUBINUR SMALL (A) 05/17/2013 2059   KETONESUR NEGATIVE 05/17/2013 2059   PROTEINUR 30 (A) 05/17/2013 2059   UROBILINOGEN 0.2 05/17/2013 2059   NITRITE NEGATIVE 05/17/2013 2059   LEUKOCYTESUR NEGATIVE 05/17/2013 2059   Sepsis Labs Invalid input(s): PROCALCITONIN,  WBC,  LACTICIDVEN Microbiology Recent Results (from the past 240 hour(s))  Culture, blood (routine x 2)     Status: None (Preliminary result)   Collection Time: 03/24/21  1:01 PM   Specimen: BLOOD  Result Value Ref Range Status   Specimen Description   Final    BLOOD LEFT ANTECUBITAL Performed at [facility], [address], [place], [place] [address]    Special Requests   Final    BOTTLES DRAWN AEROBIC AND ANAEROBIC Blood Culture adequate volume Performed at [facility], [address], [place], [place] [address]    Culture   Final    NO GROWTH 3 DAYS Performed at [facility], [address] [place], [place] [address]    Report Status PENDING  Incomplete  Culture, blood (routine x 2)     Status: None (Preliminary result)   Collection Time: 03/24/21  1:12 PM   Specimen: BLOOD LEFT HAND  Result Value Ref Range Status   Specimen Description   Final    BLOOD LEFT HAND Performed at  [facility], [address], [place], [place] [address]    Special Requests   Final    BOTTLES DRAWN AEROBIC AND ANAEROBIC Blood Culture adequate volume Performed at [facility], [address] [place], [place] [address]    Culture   Final    NO GROWTH 3 DAYS Performed at [facility], [address], [place], [place] [address]    Report Status PENDING  Incomplete  SARS CORONAVIRUS 2 (TAT 6-24 HRS) Nasopharyngeal Nasopharyngeal Swab     Status: None   Collection Time: 03/24/21  2:54 PM   Specimen: Nasopharyngeal Swab  Result Value Ref Range Status   SARS Coronavirus 2 NEGATIVE NEGATIVE Final    Comment: (NOTE) SARS-CoV-2 target nucleic acids are NOT DETECTED.  The SARS-CoV-2 RNA is generally detectable in upper and lower respiratory specimens during the acute phase of infection. Negative results do not preclude SARS-CoV-2 infection, do not rule out co-infections with other pathogens, and should not be used as the sole basis for treatment or other patient management decisions. Negative results must be combined with clinical observations, patient history, and epidemiological information. The expected result is Negative.  Fact Sheet for Patients: HairSlick.no  Fact Sheet for Healthcare Providers: quierodirigir.com  This test is not yet approved or cleared by the [place] FDA and  has been authorized for detection and/or diagnosis of SARS-CoV-2 by FDA under an Emergency Use Authorization (EUA). This EUA will remain  in effect (meaning this test can be used) for the duration of the COVID-19 declaration under Se ction 564(b)(1) of the Act, 21 U.S.C. section 360bbb-3(b)(1), unless the authorization is terminated or revoked sooner.  Performed at [facility], [address], [place], [place] [address]      Time coordinating discharge: Over 30 minutes  SIGNED:   Hughie Closs,  MD  [facility] 03/27/2021, 9:05 AM  If 7PM-7AM, please contact night-coverage [email or web address]

## 2021-03-29 LAB — CULTURE, BLOOD (ROUTINE X 2)
Culture: NO GROWTH
Culture: NO GROWTH
Special Requests: ADEQUATE
Special Requests: ADEQUATE

## 2023-01-03 ENCOUNTER — Emergency Department (HOSPITAL_BASED_OUTPATIENT_CLINIC_OR_DEPARTMENT_OTHER)
Admission: EM | Admit: 2023-01-03 | Discharge: 2023-01-03 | Disposition: A | Discharge status: Home / Self Care | Payer: Self-pay | Attending: Emergency Medicine | Admitting: Emergency Medicine

## 2023-01-03 ENCOUNTER — Other Ambulatory Visit: Payer: Self-pay

## 2023-01-03 ENCOUNTER — Encounter (HOSPITAL_BASED_OUTPATIENT_CLINIC_OR_DEPARTMENT_OTHER): Payer: Self-pay | Admitting: Emergency Medicine

## 2023-01-03 ENCOUNTER — Emergency Department (HOSPITAL_BASED_OUTPATIENT_CLINIC_OR_DEPARTMENT_OTHER): Payer: Self-pay

## 2023-01-03 DIAGNOSIS — S0990XA Unspecified injury of head, initial encounter: Secondary | ICD-10-CM | POA: Insufficient documentation

## 2023-01-03 DIAGNOSIS — S8001XA Contusion of right knee, initial encounter: Secondary | ICD-10-CM | POA: Insufficient documentation

## 2023-01-03 DIAGNOSIS — M791 Myalgia, unspecified site: Secondary | ICD-10-CM | POA: Insufficient documentation
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
